# Patient Record
Sex: Female | Born: 1950 | Race: White | Hispanic: No | Marital: Married | Smoking: Former smoker
Health system: Southern US, Community
[De-identification: ages and names within clinical notes are randomized; demographics above are authoritative.]

## PROBLEM LIST (undated history)

## (undated) DIAGNOSIS — F32A Depression, unspecified: Secondary | ICD-10-CM

## (undated) DIAGNOSIS — F329 Major depressive disorder, single episode, unspecified: Secondary | ICD-10-CM

## (undated) DIAGNOSIS — I10 Essential (primary) hypertension: Secondary | ICD-10-CM

## (undated) DIAGNOSIS — K5792 Diverticulitis of intestine, part unspecified, without perforation or abscess without bleeding: Secondary | ICD-10-CM

## (undated) DIAGNOSIS — E785 Hyperlipidemia, unspecified: Secondary | ICD-10-CM

## (undated) DIAGNOSIS — E119 Type 2 diabetes mellitus without complications: Secondary | ICD-10-CM

## (undated) HISTORY — PX: TONSILLECTOMY: SUR1361

## (undated) HISTORY — DX: Essential (primary) hypertension: I10

## (undated) HISTORY — PX: APPENDECTOMY: SHX54

## (undated) HISTORY — DX: Major depressive disorder, single episode, unspecified: F32.9

## (undated) HISTORY — DX: Type 2 diabetes mellitus without complications: E11.9

## (undated) HISTORY — DX: Diverticulitis of intestine, part unspecified, without perforation or abscess without bleeding: K57.92

## (undated) HISTORY — DX: Depression, unspecified: F32.A

## (undated) HISTORY — DX: Hyperlipidemia, unspecified: E78.5

---

## 2013-06-19 ENCOUNTER — Ambulatory Visit (INDEPENDENT_AMBULATORY_CARE_PROVIDER_SITE_OTHER): Payer: Self-pay | Admitting: Family Medicine

## 2013-06-19 ENCOUNTER — Encounter: Payer: Self-pay | Admitting: Family Medicine

## 2013-06-19 VITALS — BP 102/72 | Temp 98.8°F | Ht 59.0 in | Wt 200.0 lb

## 2013-06-19 DIAGNOSIS — L989 Disorder of the skin and subcutaneous tissue, unspecified: Secondary | ICD-10-CM

## 2013-06-19 DIAGNOSIS — I10 Essential (primary) hypertension: Secondary | ICD-10-CM

## 2013-06-19 DIAGNOSIS — Z7689 Persons encountering health services in other specified circumstances: Secondary | ICD-10-CM

## 2013-06-19 DIAGNOSIS — E785 Hyperlipidemia, unspecified: Secondary | ICD-10-CM

## 2013-06-19 DIAGNOSIS — Z7189 Other specified counseling: Secondary | ICD-10-CM

## 2013-06-19 DIAGNOSIS — E119 Type 2 diabetes mellitus without complications: Secondary | ICD-10-CM

## 2013-06-19 LAB — BASIC METABOLIC PANEL
BUN: 23 mg/dL (ref 6–23)
CHLORIDE: 107 meq/L (ref 96–112)
CO2: 29 meq/L (ref 19–32)
CREATININE: 0.6 mg/dL (ref 0.4–1.2)
Calcium: 9.2 mg/dL (ref 8.4–10.5)
GFR: 106.68 mL/min (ref >60.00)
GLUCOSE: 104 mg/dL — AB (ref 70–99)
Potassium: 4.4 mEq/L (ref 3.5–5.1)
Sodium: 142 mEq/L (ref 135–145)

## 2013-06-19 LAB — LIPID PANEL
CHOLESTEROL: 201 mg/dL — AB (ref 0–200)
HDL: 64.7 mg/dL (ref >39.00)
Total CHOL/HDL Ratio: 3
Triglycerides: 86 mg/dL (ref 0.0–149.0)
VLDL: 17.2 mg/dL (ref 0.0–40.0)

## 2013-06-19 LAB — HEMOGLOBIN A1C: Hgb A1c MFr Bld: 6 % (ref 4.6–6.5)

## 2013-06-19 LAB — MICROALBUMIN / CREATININE URINE RATIO
CREATININE, U: 132.7 mg/dL
Microalb Creat Ratio: 0.3 mg/g (ref 0.0–30.0)
Microalb, Ur: 0.4 mg/dL (ref 0.0–1.9)

## 2013-06-19 LAB — LDL CHOLESTEROL, DIRECT: LDL DIRECT: 113.3 mg/dL

## 2013-06-19 NOTE — Patient Instructions (Signed)
-  We have ordered labs or studies at this visit. It can take up to 1-2 weeks for results and processing. We will contact you with instructions IF your results are abnormal. Normal results will be released to your [facility]. If you have not heard from [place] or can not find your results in [facility] in 2 weeks please contact our office.  -PLEASE SIGN UP FOR MYCHART TODAY   We recommend the following healthy lifestyle measures: - eat a healthy diet consisting of lots of vegetables, fruits, beans, nuts, seeds, healthy meats such as white chicken and fish and whole grains.  - avoid fried foods, fast food, processed foods, sodas, red meet and other fattening foods.  - get a least 150 minutes of aerobic exercise per week.   -schedule follow up with dermatologist if lesion on nose persist  Follow up in: 2 months

## 2013-06-19 NOTE — Progress Notes (Signed)
Pre visit review using our clinic review tool, if applicable. No additional management support is needed unless otherwise documented below in the visit note. 

## 2013-06-19 NOTE — Progress Notes (Signed)
Chief Complaint  Patient presents with  . Establish Care    HPI:  Amanda Molina is here to establish care. Recently moved to [place].  Last PCP and physical: had routine care 6 months ago  Depression: -chronic, on and off -on SSRI in the past -separated from husband in the past -no hospitalization in the past -gets moody a times during holidays, no thoughts of self harm -she does not want to do counseling now  Diabetes Mellitus: -ran out of metformin - 2 months ago -BS at home has been 110 -does not know what hgba1c was -has not been watching diet or exercising  HLD/HTN: -ran out of medications recently  Has the following chronic problems and concerns today:  There are no active problems to display for this patient.   Health Maintenance:  ROS: See pertinent positives and negatives per HPI.  Past Medical History  Diagnosis Date  . Diabetes   . Hypertension   . Depression   . Diverticulitis   . Hyperlipidemia     Family History  Problem Relation Age of Onset  . COPD Mother   . Heart disease Mother     History   Social History  . Marital Status: Married    Spouse Name: N/A    Number of Children: N/A  . Years of Education: N/A   Social History Main Topics  . Smoking status: Former Research scientist (life sciences)  . Smokeless tobacco: None  . Alcohol Use: Yes     Comment: rare- usually not when on medicaiton   . Drug Use: None  . Sexual Activity: None   Other Topics Concern  . 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      Spiritual Beliefs: Christian      Lifestyle: not good now             Current outpatient prescriptions:albuterol (PROVENTIL HFA;VENTOLIN HFA) 108 (90 BASE) MCG/ACT inhaler, Inhale into the lungs every 6 (six) hours as needed for wheezing or shortness of breath., Disp: , Rfl: ;  citalopram (CELEXA) 10 MG tablet, Take 10 mg by mouth daily., Disp: , Rfl: ;   clonazePAM (KLONOPIN) 0.5 MG tablet, Take 0.5 mg by mouth daily., Disp: , Rfl:  fluticasone (FLOVENT HFA) 110 MCG/ACT inhaler, Inhale into the lungs 2 (two) times daily., Disp: , Rfl: ;  lisinopril-hydrochlorothiazide (PRINZIDE,ZESTORETIC) 10-12.5 MG per tablet, Take 1 tablet by mouth daily., Disp: , Rfl: ;  METFORMIN HCL PO, Take 50 mg by mouth daily., Disp: , Rfl: ;  simvastatin (ZOCOR) 20 MG tablet, Take 20 mg by mouth daily., Disp: , Rfl:   EXAM:  Filed Vitals:   06/19/13 0913  BP: 102/72  Temp: 98.8 F (37.1 C)    Body mass index is 40.37 kg/(m^2).  GENERAL: vitals reviewed and listed above, alert, oriented, appears well hydrated and in no acute distress  HEENT: atraumatic, conjunttiva clear, no obvious abnormalities on inspection of external nose and ears  NECK: no obvious masses on inspection  LUNGS: clear to auscultation bilaterally, no wheezes, rales or rhonchi, good air movement  CV: HRRR, no peripheral edema  MS: moves all extremities without noticeable abnormality  PSYCH: pleasant and cooperative, no obvious depression or anxiety  ASSESSMENT AND PLAN:  Discussed the following assessment and plan:  Encounter to establish care  Diabetes mellitus - Plan: Hemoglobin A1c, Microalbumin/Creatinine Ratio, Urine  Other and unspecified hyperlipidemia - Plan: Lipid  Panel  Essential hypertension, benign - Plan: Basic metabolic panel  Skin lesion  -We reviewed the PMH, PSH, FH, SH, Meds and Allergies. -We provided refills for any medications we will prescribe as needed. -We addressed current concerns per orders and patient instructions. -We have asked for records for pertinent exams, studies, vaccines and notes from previous providers. -We have advised patient to follow up per instructions below. -advised CPE -FASTING LABS -noted what appears to be AK on nose versus other and discussed potential for skin cancer and advised she see dermatology - she does not have  insurance currently and prefer cryotherapy today and follow up with derm in 2 months when has insurance - risks of tx and waiting discussed and cryotherapy performed -offered flu and pneumo vaccines -discussed restarting medications after labs- she want to discontinue as many medications as possible   -Patient advised to return or notify a doctor immediately if symptoms worsen or persist or new concerns arise.  Patient Instructions  -We have ordered labs or studies at this visit. It can take up to 1-2 weeks for results and processing. We will contact you with instructions IF your results are abnormal. Normal results will be released to your [facility]. If you have not heard from [place] or can not find your results in [facility] in 2 weeks please contact our office.  -PLEASE SIGN UP FOR MYCHART TODAY   We recommend the following healthy lifestyle measures: - eat a healthy diet consisting of lots of vegetables, fruits, beans, nuts, seeds, healthy meats such as white chicken and fish and whole grains.  - avoid fried foods, fast food, processed foods, sodas, red meet and other fattening foods.  - get a least 150 minutes of aerobic exercise per week.   -schedule follow up with dermatologist if lesion on nose persist  Follow up in: 2 months      Stepfanie Yott R.

## 2013-09-17 ENCOUNTER — Encounter: Payer: Self-pay | Admitting: Family Medicine

## 2013-09-17 ENCOUNTER — Ambulatory Visit (INDEPENDENT_AMBULATORY_CARE_PROVIDER_SITE_OTHER): Payer: Medicare Other | Admitting: Family Medicine

## 2013-09-17 VITALS — BP 150/78 | Temp 98.4°F | Wt 203.0 lb

## 2013-09-17 DIAGNOSIS — E119 Type 2 diabetes mellitus without complications: Secondary | ICD-10-CM | POA: Diagnosis not present

## 2013-09-17 DIAGNOSIS — F3289 Other specified depressive episodes: Secondary | ICD-10-CM | POA: Diagnosis not present

## 2013-09-17 DIAGNOSIS — I10 Essential (primary) hypertension: Secondary | ICD-10-CM

## 2013-09-17 DIAGNOSIS — E785 Hyperlipidemia, unspecified: Secondary | ICD-10-CM | POA: Diagnosis not present

## 2013-09-17 DIAGNOSIS — F32A Depression, unspecified: Secondary | ICD-10-CM

## 2013-09-17 DIAGNOSIS — F329 Major depressive disorder, single episode, unspecified: Secondary | ICD-10-CM | POA: Diagnosis not present

## 2013-09-17 DIAGNOSIS — L989 Disorder of the skin and subcutaneous tissue, unspecified: Secondary | ICD-10-CM

## 2013-09-17 MED ORDER — LISINOPRIL-HYDROCHLOROTHIAZIDE 10-12.5 MG PO TABS: 1.0000 | ORAL_TABLET | Freq: Every day | ORAL | Status: DC

## 2013-09-17 MED ORDER — CITALOPRAM HYDROBROMIDE 10 MG PO TABS: 10.0000 mg | ORAL_TABLET | Freq: Every day | ORAL | Status: DC

## 2013-09-17 NOTE — Progress Notes (Signed)
Pre visit review using our clinic review tool, if applicable. No additional management support is needed unless otherwise documented below in the visit note. 

## 2013-09-17 NOTE — Progress Notes (Signed)
Chief Complaint  Patient presents with  . 3 month follow up    HPI:  Follow up:  AK nose: -tx with cryotherapy last visit -improved bu not gone  Diabetes: Lab Results  Component Value Date   HGBA1C 6.0 06/19/2013  -not on metformin as was doing great off of metformin  -home blood sugars in low 100s  HLD: Lab Results  Component Value Date   CHOL 201* 06/19/2013   HDL 64.70 06/19/2013   LDLDIRECT 113.3 06/19/2013   TRIG 86.0 06/19/2013   CHOLHDL 3 06/19/2013  -on simvastatin  HTN: -not taking the lisinopril-hctz    Depression/Anxiety: -celexa and klonopin - does not take these any longer -she doesn't wanted to take medications but feels like needs to be back on celexa -depressed and stressed about relationship with husband -she wants to started counseling -denies suicidal thoughts or thoughts of self harm  Asthma?; -flovent and alb on med list  Last physicaL?  ROS: See pertinent positives and negatives per HPI.  Past Medical History  Diagnosis Date  . Diabetes   . Hypertension   . Depression   . Diverticulitis   . Hyperlipidemia     Past Surgical History  Procedure Laterality Date  . Appendectomy      teenager  . Tonsillectomy      Family History  Problem Relation Age of Onset  . COPD Mother   . Heart disease Mother     History   Social History  . Marital Status: Married    Spouse Name: N/A    Number of Children: N/A  . Years of Education: N/A   Social History Main Topics  . Smoking status: Former Research scientist (life sciences)  . Smokeless tobacco: None  . Alcohol Use: Yes     Comment: rare- usually not when on medicaiton   . Drug Use: None  . Sexual Activity: None   Other Topics Concern  . 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  GENERAL: vitals reviewed and listed above, alert, oriented, appears well hydrated and in no acute distress  HEENT: atraumatic, conjunttiva clear, no obvious abnormalities on inspection of external nose and ears  NECK: no obvious masses on inspection  LUNGS: clear to auscultation bilaterally, no wheezes, rales or rhonchi, good air movement  CV: HRRR, no peripheral edema  MS: moves all extremities without noticeable abnormality  PSYCH: pleasant and cooperative, no obvious depression or anxiety  ASSESSMENT AND PLAN:  Discussed the following assessment and plan:  Depression - Plan: citalopram (CELEXA) 10 MG tablet  Diabetes mellitus  Hyperlipemia  Essential hypertension, benign - Plan: lisinopril-hydrochlorothiazide (PRINZIDE,ZESTORETIC) 10-12.5 MG per tablet  Skin lesion of face - Plan: Ambulatory referral to Dermatology  -counseled at length -opted to restart  celexa after discussion risks/benefits -counseling info provided -will have office manager check on helping her with medicare enrollement help -restart BP medication -follow up 1 month and fasting labs then per her request -Patient advised to return or notify a doctor immediately if symptoms worsen or persist or new concerns arise.  Patient Instructions  We recommend the following healthy lifestyle measures: - eat a healthy diet consisting of lots of vegetables, fruits, beans, nuts, seeds, healthy meats such as white chicken and fish and whole grains.  - avoid fried foods, fast food, processed  foods, sodas, red meet and other fattening foods.  - get a least 150 minutes of aerobic exercise per week.   START the celexa and the lisinopril-hctz  Call for counseling  See a dermatologist for the lesion on your nose  Follow up in: 1 month      Yoceline Bazar R.

## 2013-09-17 NOTE — Patient Instructions (Signed)
We recommend the following healthy lifestyle measures: - eat a healthy diet consisting of lots of vegetables, fruits, beans, nuts, seeds, healthy meats such as white chicken and fish and whole grains.  - avoid fried foods, fast food, processed foods, sodas, red meet and other fattening foods.  - get a least 150 minutes of aerobic exercise per week.   START the celexa and the lisinopril-hctz  Call for counseling  See a dermatologist for the lesion on your nose  Follow up in: 1 month

## 2013-09-21 ENCOUNTER — Telehealth: Payer: Self-pay | Admitting: Family Medicine

## 2013-09-21 NOTE — Telephone Encounter (Signed)
Relevant patient education mailed to patient.  

## 2013-09-28 DIAGNOSIS — D485 Neoplasm of uncertain behavior of skin: Secondary | ICD-10-CM | POA: Diagnosis not present

## 2013-09-28 DIAGNOSIS — L821 Other seborrheic keratosis: Secondary | ICD-10-CM | POA: Diagnosis not present

## 2013-09-28 DIAGNOSIS — Z9189 Other specified personal risk factors, not elsewhere classified: Secondary | ICD-10-CM | POA: Diagnosis not present

## 2013-09-28 DIAGNOSIS — L570 Actinic keratosis: Secondary | ICD-10-CM | POA: Diagnosis not present

## 2013-10-09 ENCOUNTER — Telehealth: Payer: Self-pay

## 2013-10-09 NOTE — Telephone Encounter (Signed)
Relevant patient education mailed to patient.  

## 2013-10-22 ENCOUNTER — Ambulatory Visit (INDEPENDENT_AMBULATORY_CARE_PROVIDER_SITE_OTHER): Payer: Medicare Other | Admitting: Family Medicine

## 2013-10-22 ENCOUNTER — Encounter: Payer: Self-pay | Admitting: Family Medicine

## 2013-10-22 VITALS — BP 120/66 | HR 80 | Temp 98.4°F | Ht 59.0 in | Wt 205.0 lb

## 2013-10-22 DIAGNOSIS — I10 Essential (primary) hypertension: Secondary | ICD-10-CM

## 2013-10-22 DIAGNOSIS — F3289 Other specified depressive episodes: Secondary | ICD-10-CM

## 2013-10-22 DIAGNOSIS — E119 Type 2 diabetes mellitus without complications: Secondary | ICD-10-CM

## 2013-10-22 DIAGNOSIS — F329 Major depressive disorder, single episode, unspecified: Secondary | ICD-10-CM

## 2013-10-22 DIAGNOSIS — E785 Hyperlipidemia, unspecified: Secondary | ICD-10-CM | POA: Diagnosis not present

## 2013-10-22 DIAGNOSIS — F32A Depression, unspecified: Secondary | ICD-10-CM

## 2013-10-22 LAB — BASIC METABOLIC PANEL
BUN: 19 mg/dL (ref 6–23)
CHLORIDE: 101 meq/L (ref 96–112)
CO2: 31 mEq/L (ref 19–32)
Calcium: 9.4 mg/dL (ref 8.4–10.5)
Creatinine, Ser: 0.9 mg/dL (ref 0.4–1.2)
GFR: 70.34 mL/min (ref >60.00)
Glucose, Bld: 99 mg/dL (ref 70–99)
Potassium: 4.1 mEq/L (ref 3.5–5.1)
SODIUM: 140 meq/L (ref 135–145)

## 2013-10-22 LAB — LIPID PANEL
Cholesterol: 207 mg/dL — ABNORMAL HIGH (ref 0–200)
HDL: 65.7 mg/dL (ref >39.00)
LDL Cholesterol: 118 mg/dL — ABNORMAL HIGH (ref 0–99)
Total CHOL/HDL Ratio: 3
Triglycerides: 117 mg/dL (ref 0.0–149.0)
VLDL: 23.4 mg/dL (ref 0.0–40.0)

## 2013-10-22 LAB — MICROALBUMIN / CREATININE URINE RATIO
CREATININE, U: 116 mg/dL
MICROALB/CREAT RATIO: 0.3 mg/g (ref 0.0–30.0)
Microalb, Ur: 0.4 mg/dL (ref 0.0–1.9)

## 2013-10-22 LAB — HEMOGLOBIN A1C: HEMOGLOBIN A1C: 6.4 % (ref 4.6–6.5)

## 2013-10-22 MED ORDER — CITALOPRAM HYDROBROMIDE 20 MG PO TABS: 10.0000 mg | ORAL_TABLET | Freq: Every day | ORAL | Status: DC

## 2013-10-22 NOTE — Progress Notes (Signed)
No chief complaint on file.   HPI:  Depression: -restarted celexa last visit -reports is doing great - relationships have improved -she is doing so much better but still a bit of irritability in the afternoons per family members  -not doing counseling yet  HTN: -restarted lis-hctz last visit -reports feels great -denies: CP, SOB, palpitations, swelling  DM/HLD: -labs today  ROS: See pertinent positives and negatives per HPI.  Past Medical History  Diagnosis Date  . Diabetes   . Hypertension   . Depression   . Diverticulitis   . Hyperlipidemia     Past Surgical History  Procedure Laterality Date  . Appendectomy      teenager  . Tonsillectomy      Family History  Problem Relation Age of Onset  . COPD Mother   . Heart disease Mother     History   Social History  . Marital Status: Married    Spouse Name: N/A    Number of Children: N/A  . Years of Education: N/A   Social History Main Topics  . Smoking status: Former Research scientist (life sciences)  . Smokeless tobacco: None  . Alcohol Use: Yes     Comment: rare- usually not when on medicaiton   . Drug Use: None  . Sexual Activity: None   Other Topics Concern  . 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  GENERAL: vitals reviewed and listed above, alert, oriented, appears well hydrated and in no acute distress  HEENT: atraumatic, conjunttiva clear, no obvious  abnormalities on inspection of external nose and ears  NECK: no obvious masses on inspection  LUNGS: clear to auscultation bilaterally, no wheezes, rales or rhonchi, good air movement  CV: HRRR, no peripheral edema  MS: moves all extremities without noticeable abnormality  PSYCH: pleasant and cooperative, no obvious depression or anxiety  ASSESSMENT AND PLAN:  Discussed the following assessment and plan:  Depression - Plan: citalopram (CELEXA) 20 MG tablet  Hypertension  Diabetes - Plan: Hemoglobin I6E, Basic metabolic panel, Microalbumin/Creatinine Ratio, Urine  Hyperlipemia - Plan: Lipid Panel  -increasing celexa to 20mg  daily -advised counseling -FASTING labs today -continue to monitor blood pressure -Patient advised to return or notify a doctor immediately if symptoms worsen or persist or new concerns arise.  Patient Instructions  FOR YOUR ANXIETY, STRESS or DEPRESSION:  []  Increase the celexa to 20mg  daily  []  Seek counseling - this is as effective as medications and will help to get at the root of the imbalance. Please use the number provided to set up an appointment.  []  Ensure AT LEAST 150 minutes of cardiovascular exercise per week - 30 minutes of sweaty exercise daily is best.  []  Set a schedule that includes adequate time for sleep, fun activities and exercise.  []  Medications are best used short term while finding a healthier  more balanced life that promotes good emotional and mental health. I do not prescribe sleep medications such as Ambien, etc. or controlled anxiety medications such as Xanax, Klonopin, etc. long term in adult patients and will have you see a psychiatrist if these types of medications are required on more then a temporary basis.  FOR YOUR DIABETES:  []  Eat a healthy low carb diet (avoid sweets, sweet drinks, breads, potatoes, rice, etc.) and ensure 3 small meals daily.  []  Get AT LEAST 150 minutes of cardiovascular exercise per week - 30  minutes per day is best of sustained sweaty exercise.  []  See an eye doctor every year and fax your diabetic eye exam to our office.  Fax: [phone]  []  Take good care of your feet and keep them soft and callus free. Check your feet daily and wear comfortable shoes. See your doctor immediately if you have any cuts, calluses or wounds on your feet.  -We have ordered labs or studies at this visit. It can take up to 1-2 weeks for results and processing. We will contact you with instructions IF your results are abnormal. Normal results will be released to your [facility]. If you have not heard from [place] or can not find your results in [facility] in 2 weeks please contact our office.   Follow up in 3 months for [place] and follow up        Lucretia Kern

## 2013-10-22 NOTE — Progress Notes (Signed)
Pre visit review using our clinic review tool, if applicable. No additional management support is needed unless otherwise documented below in the visit note. 

## 2013-10-22 NOTE — Patient Instructions (Addendum)
FOR YOUR ANXIETY, STRESS or DEPRESSION:  []  Increase the celexa to 20mg  daily  []  Seek counseling - this is as effective as medications and will help to get at the root of the imbalance. Please use the number provided to set up an appointment.  []  Ensure AT LEAST 150 minutes of cardiovascular exercise per week - 30 minutes of sweaty exercise daily is best.  []  Set a schedule that includes adequate time for sleep, fun activities and exercise.  []  Medications are best used short term while finding a healthier more balanced life that promotes good emotional and mental health. I do not prescribe sleep medications such as Ambien, etc. or controlled anxiety medications such as Xanax, Klonopin, etc. long term in adult patients and will have you see a psychiatrist if these types of medications are required on more then a temporary basis.  FOR YOUR DIABETES:  []  Eat a healthy low carb diet (avoid sweets, sweet drinks, breads, potatoes, rice, etc.) and ensure 3 small meals daily.  []  Get AT LEAST 150 minutes of cardiovascular exercise per week - 30 minutes per day is best of sustained sweaty exercise.  []  See an eye doctor every year and fax your diabetic eye exam to our office.  Fax: [phone]  []  Take good care of your feet and keep them soft and callus free. Check your feet daily and wear comfortable shoes. See your doctor immediately if you have any cuts, calluses or wounds on your feet.  -We have ordered labs or studies at this visit. It can take up to 1-2 weeks for results and processing. We will contact you with instructions IF your results are abnormal. Normal results will be released to your [facility]. If you have not heard from [place] or can not find your results in [facility] in 2 weeks please contact our office.   Follow up in 3 months for [place] and follow up

## 2013-10-26 ENCOUNTER — Telehealth: Payer: Self-pay | Admitting: Family Medicine

## 2013-10-26 DIAGNOSIS — F32A Depression, unspecified: Secondary | ICD-10-CM

## 2013-10-26 DIAGNOSIS — F329 Major depressive disorder, single episode, unspecified: Secondary | ICD-10-CM

## 2013-10-26 MED ORDER — CITALOPRAM HYDROBROMIDE 20 MG PO TABS: 20.0000 mg | ORAL_TABLET | Freq: Every day | ORAL | Status: DC

## 2013-10-26 NOTE — Telephone Encounter (Signed)
It was sent as 20mg  but for some reason  0.5 tab per day copied to the instructions - please let her know I resent this and am sorry for the error. Thanks.

## 2013-10-26 NOTE — Telephone Encounter (Signed)
Patient informed. 

## 2013-10-26 NOTE — Telephone Encounter (Signed)
Is it OK to send in the Rx for Celexa 20mg ?  The office visit states you were increasing to 20mg  daily but the Rx that was sent in was for Celexa 1mg -take 0.5mg  daily.

## 2013-10-26 NOTE — Telephone Encounter (Signed)
Pt was to have celexa increased celexa to 20mg  daily, but new rx was for 20 mg, but instructions states .05   tab. Pt need new rx  for this  [facility] [address] pls call pt w/ new instructions.

## 2013-11-26 ENCOUNTER — Encounter: Payer: Self-pay | Admitting: Physician Assistant

## 2013-11-26 ENCOUNTER — Ambulatory Visit (INDEPENDENT_AMBULATORY_CARE_PROVIDER_SITE_OTHER): Payer: Medicare Other | Admitting: Physician Assistant

## 2013-11-26 VITALS — BP 114/70 | HR 84 | Temp 98.7°F | Resp 18

## 2013-11-26 DIAGNOSIS — M25579 Pain in unspecified ankle and joints of unspecified foot: Secondary | ICD-10-CM | POA: Diagnosis not present

## 2013-11-26 LAB — URIC ACID: Uric Acid, Serum: 5.3 mg/dL (ref 2.4–7.0)

## 2013-11-26 MED ORDER — HYDROCODONE-ACETAMINOPHEN 10-325 MG PO TABS: 1.0000 | ORAL_TABLET | Freq: Three times a day (TID) | ORAL | Status: DC | PRN

## 2013-11-26 MED ORDER — PREDNISONE 20 MG PO TABS: ORAL_TABLET | ORAL | Status: DC

## 2013-11-26 NOTE — Patient Instructions (Addendum)
Rest, Ice, Elevate the ankle to speed recovery.  Prednisone 2 pills daily for inflammation taken with food. Then taper as directed.  Vicodin for pain. Do not drive while taking this medication as it will inhibit your ability to operate a motor vehicle.  Follow up next week with your Primary Care Provider to reassess, or sooner for worsening symptoms despite treatment.    Gout Gout is when your joints become red, sore, and swell (inflammed). This is caused by the buildup of uric acid crystals in the joints. Uric acid is a chemical that is normally in the blood. If the level of uric acid gets too high in the blood, these crystals form in your joints and tissues. Over time, these crystals can form into masses near the joints and tissues. These masses can destroy bone and cause the bone to look misshapen (deformed). HOME CARE   Do not take aspirin for pain.  Only take medicine as told by your doctor.  Rest the joint as much as you can. When in bed, keep sheets and blankets off painful areas.  Keep the sore joints raised (elevated).  Put warm or cold packs on painful joints. Use of warm or cold packs depends on which works best for you.  Use crutches if the painful joint is in your leg.  Drink enough fluids to keep your pee (urine) clear or pale yellow. Limit alcohol, sugary drinks, and drinks with fructose in them.  Follow your diet instructions. Pay careful attention to how much protein you eat. Include fruits, vegetables, whole grains, and fat-free or low-fat milk products in your daily diet. Talk to your doctor or dietician about the use of coffee, vitamin C, and cherries. These may help lower uric acid levels.  Keep a healthy body weight. GET HELP RIGHT AWAY IF:   You have watery poop (diarrhea), throw up (vomit), or have any side effects from medicines.  You do not feel better in 24 hours, or you are getting worse.  Your joint becomes suddenly more tender, and you have chills or  a fever. MAKE SURE YOU:   Understand these instructions.  Will watch your condition.  Will get help right away if you are not doing well or get worse. Document Released: 03/13/2008 Document Revised: 09/29/2012 Document Reviewed: 09/12/2009 [facility] Patient Information 2014 [place].

## 2013-11-26 NOTE — Progress Notes (Signed)
Subjective:    Patient ID: Amanda Molina, female    DOB: 08-Oct-1950, 63 y.o.   MRN: [id number]  Ankle Pain  The incident occurred 3 to 5 days ago. The incident occurred at home. There was no injury mechanism (does not remember hurting ankle). Quality: "just hurts" The pain is at a severity of 10/10 (can't sleep). The pain is severe. The pain has been constant since onset. Associated symptoms include an inability to bear weight and a loss of motion. Pertinent negatives include no loss of sensation, muscle weakness, numbness or tingling. She reports no foreign bodies present. The symptoms are aggravated by movement, palpation and weight bearing. She has tried NSAIDs (percocet left over from old dental procedure.) for the symptoms. The treatment provided mild relief.      Review of Systems  Constitutional: Negative for fever and chills.  Respiratory: Negative for shortness of breath.   Gastrointestinal: Negative for nausea, vomiting and diarrhea.  Musculoskeletal: Positive for gait problem and joint swelling.  Neurological: Negative for tingling and numbness.  All other systems reviewed and are negative.    Past Medical History  Diagnosis Date  . Diabetes   . Hypertension   . Depression   . Diverticulitis   . Hyperlipidemia    Past Surgical History  Procedure Laterality Date  . Appendectomy      teenager  . Tonsillectomy      reports that she has quit smoking. She does not have any smokeless tobacco history on file. She reports that she drinks alcohol. Her drug history is not on file. family history includes COPD in her mother; Heart disease in her mother. Allergies  Allergen Reactions  . Sulfa Antibiotics        Objective:   Physical Exam  Nursing note and vitals reviewed. Constitutional: She is oriented to person, place, and time. She appears well-developed and well-nourished. No distress.  HENT:  Head: Normocephalic and atraumatic.  Eyes: Conjunctivae and EOM  are normal. Pupils are equal, round, and reactive to light.  Neck: Normal range of motion. Neck supple.  Cardiovascular: Normal rate, regular rhythm, normal heart sounds and intact distal pulses.  Exam reveals no gallop and no friction rub.   No murmur heard. Pulmonary/Chest: Effort normal and breath sounds normal. No respiratory distress. She has no wheezes. She has no rales. She exhibits no tenderness.  Musculoskeletal: She exhibits edema (limited to the left ankle, very mild) and tenderness.  Painful range of motion of the left ankle, especially with lateral movement and dorsiflexion.  Pain to palpation over the lateral and medial malleolus of the left ankle. No pain to palpation of the proximal fibular head, or the MTPs of the left foot. No pain to palpation of the arch of the left foot.   Neurological: She is alert and oriented to person, place, and time.  Skin: Skin is warm and dry. No rash noted. She is not diaphoretic. No erythema. No pallor.  There is no erythema to left ankle. Tenderness to palpation as mentioned above. No excessive warmth, no fluctuance. No visible foreign body or puncture.  Psychiatric: She has a normal mood and affect. Her behavior is normal. Judgment and thought content normal.     Filed Vitals:   11/26/13 1623  BP: 114/70  Pulse: 84  Temp: 98.7 F (37.1 C)  Resp: 18     Lab Results  Component Value Date   GLUCOSE 99 10/22/2013   CHOL 207* 10/22/2013   TRIG 117.0 10/22/2013  HDL 65.70 10/22/2013   LDLDIRECT 113.3 06/19/2013   LDLCALC 118* 10/22/2013   NA 140 10/22/2013   K 4.1 10/22/2013   CL 101 10/22/2013   CREATININE 0.9 10/22/2013   BUN 19 10/22/2013   CO2 31 10/22/2013   HGBA1C 6.4 10/22/2013   MICROALBUR 0.4 10/22/2013          Assessment & Plan:  Amanda Molina was seen today for ankle pain.  Diagnoses and associated orders for this visit:  Pain in joint, ankle and foot Comments: Sudden onset, no injury, possible hx of same that resolved without treatment.  ?gout. - Uric Acid - HYDROcodone-acetaminophen (NORCO) 10-325 MG per tablet; Take 1 tablet by mouth every 8 (eight) hours as needed (NO DRIVING WHILE TAKING THIS MEDICATION.). - predniSONE (DELTASONE) 20 MG tablet; 2 pills tonight, then 2 pills daily for 3 days, then 1 pill daily for 3 days, then half a pill every other day for 2 weeks. Take every dose with a meal.   No diagnosis of Gout, however history of similar ankle pain in the past that resolved spontaneously after a few days to a week. Dr. Sherren Mocha was consulted, and strongly felt that, with the history given, and lack of injury, that the pt was experiencing gout. Prednisone course with taper and Vicodin PRN due to severity of pain. Will have pt follow up with PCP early next week.   Plan is to follow up with PCP early next week, or sooner if symptoms worsen despite treatment.    Patient Instructions  Rest, Ice, Elevate the ankle to speed recovery.  Prednisone 2 pills daily for inflammation taken with food. Then taper as directed.  Vicodin for pain. Do not drive while taking this medication as it will inhibit your ability to operate a motor vehicle.  Follow up next week with your Primary Care Provider to reassess, or sooner for worsening symptoms despite treatment.

## 2013-12-01 ENCOUNTER — Encounter: Payer: Self-pay | Admitting: Family Medicine

## 2013-12-01 ENCOUNTER — Ambulatory Visit (INDEPENDENT_AMBULATORY_CARE_PROVIDER_SITE_OTHER): Payer: Medicare Other | Admitting: Family Medicine

## 2013-12-01 VITALS — BP 120/82 | HR 82 | Temp 98.0°F | Ht 59.0 in | Wt 209.5 lb

## 2013-12-01 DIAGNOSIS — I10 Essential (primary) hypertension: Secondary | ICD-10-CM

## 2013-12-01 DIAGNOSIS — F329 Major depressive disorder, single episode, unspecified: Secondary | ICD-10-CM

## 2013-12-01 DIAGNOSIS — E119 Type 2 diabetes mellitus without complications: Secondary | ICD-10-CM | POA: Diagnosis not present

## 2013-12-01 DIAGNOSIS — E785 Hyperlipidemia, unspecified: Secondary | ICD-10-CM

## 2013-12-01 DIAGNOSIS — M109 Gout, unspecified: Secondary | ICD-10-CM | POA: Diagnosis not present

## 2013-12-01 DIAGNOSIS — F3289 Other specified depressive episodes: Secondary | ICD-10-CM | POA: Diagnosis not present

## 2013-12-01 DIAGNOSIS — F32A Depression, unspecified: Secondary | ICD-10-CM

## 2013-12-01 NOTE — Progress Notes (Signed)
Pre visit review using our clinic review tool, if applicable. No additional management support is needed unless otherwise documented below in the visit note. 

## 2013-12-01 NOTE — Patient Instructions (Signed)

## 2013-12-01 NOTE — Progress Notes (Signed)
No chief complaint on file.   HPI:  Follow up Gout: -seen last week by Roslynn Amble for acute pain and swelling of L ankle with hx similar - treated with prednisone and norco per review of notes -reports: doing so much better, mild pain but swelling resolved and pain so much better - on taper of prednisone, not using pain medication -denies:fevers, trauma, malaise, redness, falls  Depression: -reports pharamcist told her to take 0.5 tablets antidepressant, but she is taking whole tablet like I told her and doing well  ROS: See pertinent positives and negatives per HPI.  Past Medical History  Diagnosis Date  . Diabetes   . Hypertension   . Depression   . Diverticulitis   . Hyperlipidemia     Past Surgical History  Procedure Laterality Date  . Appendectomy      teenager  . Tonsillectomy      Family History  Problem Relation Age of Onset  . COPD Mother   . Heart disease Mother     History   Social History  . Marital Status: Married    Spouse Name: N/A    Number of Children: N/A  . Years of Education: N/A   Social History Main Topics  . Smoking status: Former Research scientist (life sciences)  . Smokeless tobacco: None  . Alcohol Use: Yes     Comment: rare- usually not when on medicaiton   . Drug Use: None  . Sexual Activity: None   Other Topics Concern  . 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 Take  every dose with a meal., Disp: 15 tablet, Rfl: 0  EXAM:  Filed Vitals:   12/01/13 0913  BP: 120/82  Pulse: 82  Temp: 98 F (36.7 C)    Body mass index is 42.29 kg/(m^2).  GENERAL: vitals reviewed and listed above, alert, oriented, appears well hydrated and in no acute distress  HEENT: atraumatic, conjunttiva clear, no obvious abnormalities on inspection of external nose and ears  NECK: no obvious masses on inspection  LUNGS: clear to auscultation bilaterally, no wheezes, rales or rhonchi, good air movement  CV: HRRR, no peripheral edema  MS: moves all extremities without noticeable abnormality - no swelling or erythema of L ankle, normal  ROM,  NV intact distally, no TTP  PSYCH: pleasant and cooperative, no obvious depression or anxiety  ASSESSMENT AND PLAN:  Discussed the following assessment and plan:  Gout  Depression  Hypertension  Diabetes  Hyperlipemia  -doing well and recovering well - suspect this was gout and discussed other potential etiologies plan if recurs/etc -cleared up confusion on antidepressant and advised my assistant to contact pharmacy -follow up as needed and routinely -Patient advised to return or notify a doctor immediately if symptoms worsen or persist or new concerns arise.  Patient Instructions  Gout Gout is  an inflammatory arthritis caused by a buildup of uric acid crystals in the joints. Uric acid is a chemical that is normally present in the blood. When the level of uric acid in the blood is too high it can form crystals that deposit in your joints and tissues. This causes joint redness, soreness, and swelling (inflammation). Repeat attacks are common. Over time, uric acid crystals can form into masses (tophi) near a joint, destroying bone and causing disfigurement. Gout is treatable and often preventable. CAUSES  The disease begins with elevated levels of uric acid in the blood. Uric acid is produced by your body when it breaks down a  naturally found substance called purines. Certain foods you eat, such as meats and fish, contain high amounts of purines. Causes of an elevated uric acid level include:  Being passed down from parent to child (heredity).  Diseases that cause increased uric acid production (such as obesity, psoriasis, and certain cancers).  Excessive alcohol use.  Diet, especially diets rich in meat and seafood.  Medicines, including certain cancer-fighting medicines (chemotherapy), water pills (diuretics), and aspirin.  Chronic kidney disease. The kidneys are no longer able to remove uric acid well.  Problems with metabolism. Conditions strongly associated with gout include:  Obesity.  High blood pressure.  High cholesterol.  Diabetes. Not everyone with elevated uric acid levels gets gout. It is not understood why some people get gout and others do not. Surgery, joint injury, and eating too much of certain foods are some of the factors that can lead to gout attacks. SYMPTOMS   An attack of gout comes on quickly. It causes intense pain with redness, swelling, and warmth in a joint.  Fever can occur.  Often, only one joint is involved. Certain joints are more commonly involved:  Base of the big toe.  Knee.  Ankle.  Wrist.  Finger. Without treatment, an attack usually goes away in a few days to weeks. Between attacks, you usually will not have symptoms, which is different from many other forms of arthritis. DIAGNOSIS  Your caregiver will suspect gout based on your symptoms and exam. In some cases, tests may be recommended. The tests may include:  Blood tests.  Urine tests.  X-rays.  Joint fluid exam. This exam requires a needle to remove fluid from the joint (arthrocentesis). Using a microscope, gout is confirmed when uric acid crystals are seen in the joint fluid. TREATMENT  There are two phases to gout treatment: treating the sudden onset (acute) attack and preventing attacks  (prophylaxis).  Treatment of an Acute Attack.  Medicines are used. These include anti-inflammatory medicines or steroid medicines.  An injection of steroid medicine into the affected joint is sometimes necessary.  The painful joint is rested. Movement can worsen the arthritis.  You may use warm or cold treatments on painful joints, depending which works best for you.  Treatment to Prevent Attacks.  If you suffer from frequent gout attacks, your caregiver may advise preventive medicine. These medicines are started after the acute attack subsides. These medicines either help your kidneys eliminate uric acid from your body or decrease your uric acid production. You may need to stay on these medicines for a very long time.  The early phase of treatment with preventive medicine can be associated with an increase in acute gout attacks. For this reason, during the first few months of treatment, your caregiver may also advise you to take medicines usually used for acute gout treatment. Be sure you understand your  caregiver's directions. Your caregiver may make several adjustments to your medicine dose before these medicines are effective.  Discuss dietary treatment with your caregiver or dietitian. Alcohol and drinks high in sugar and fructose and foods such as meat, poultry, and seafood can increase uric acid levels. Your caregiver or dietician can advise you on drinks and foods that should be limited. HOME CARE INSTRUCTIONS   Do not take aspirin to relieve pain. This raises uric acid levels.  Only take over-the-counter or prescription medicines for pain, discomfort, or fever as directed by your caregiver.  Rest the joint as much as possible. When in bed, keep sheets and blankets off painful areas.  Keep the affected joint raised (elevated).  Apply warm or cold treatments to painful joints. Use of warm or cold treatments depends on which works best for you.  Use crutches if the painful joint  is in your leg.  Drink enough fluids to keep your urine clear or pale yellow. This helps your body get rid of uric acid. Limit alcohol, sugary drinks, and fructose drinks.  Follow your dietary instructions. Pay careful attention to the amount of protein you eat. Your daily diet should emphasize fruits, vegetables, whole grains, and fat-free or low-fat milk products. Discuss the use of coffee, vitamin C, and cherries with your caregiver or dietician. These may be helpful in lowering uric acid levels.  Maintain a healthy body weight. SEEK MEDICAL CARE IF:   You develop diarrhea, vomiting, or any side effects from medicines.  You do not feel better in 24 hours, or you are getting worse. SEEK IMMEDIATE MEDICAL CARE IF:   Your joint becomes suddenly more tender, and you have chills or a fever. MAKE SURE YOU:   Understand these instructions.  Will watch your condition.  Will get help right away if you are not doing well or get worse. Document Released: 06/01/2000 Document Revised: 09/29/2012 Document Reviewed: 01/16/2012 [facility] Patient Information 2014 [place], [place], [place]

## 2013-12-16 LAB — HM DIABETES EYE EXAM

## 2014-01-22 ENCOUNTER — Encounter: Payer: Self-pay | Admitting: Family Medicine

## 2014-01-22 ENCOUNTER — Ambulatory Visit (INDEPENDENT_AMBULATORY_CARE_PROVIDER_SITE_OTHER): Payer: Medicare Other | Admitting: Family Medicine

## 2014-01-22 VITALS — BP 124/82 | HR 76 | Temp 98.0°F | Ht 59.0 in | Wt 205.0 lb

## 2014-01-22 DIAGNOSIS — F329 Major depressive disorder, single episode, unspecified: Secondary | ICD-10-CM

## 2014-01-22 DIAGNOSIS — E119 Type 2 diabetes mellitus without complications: Secondary | ICD-10-CM | POA: Diagnosis not present

## 2014-01-22 DIAGNOSIS — F3289 Other specified depressive episodes: Secondary | ICD-10-CM

## 2014-01-22 DIAGNOSIS — I10 Essential (primary) hypertension: Secondary | ICD-10-CM

## 2014-01-22 DIAGNOSIS — F32A Depression, unspecified: Secondary | ICD-10-CM

## 2014-01-22 NOTE — Progress Notes (Signed)
Pre visit review using our clinic review tool, if applicable. No additional management support is needed unless otherwise documented below in the visit note. 

## 2014-01-22 NOTE — Patient Instructions (Addendum)
FOR YOUR DIABETES:  []  Eat a healthy low carb diet (avoid sweets, sweet drinks, breads, potatoes, rice, etc.) and ensure 3 small meals daily.  []  Get AT LEAST 150 minutes of cardiovascular exercise per week - 30 minutes per day is best of sustained sweaty exercise.  []  Take all of your medications every day as directed by your doctor. Call your doctor immediately if you have any questions about your medications or are running low.  []  Check your blood sugar often and when you feel unwell and keep a log to bring to all health appointments. FASTING: before you eat anything in the morning POSTPRANDIAL: 1-2 hours after a meal  []  If any low blood sugars < 70, eat a snack and call your doctor immediately.  []  See an eye doctor every year and fax your diabetic eye exam to our office.  Fax: [phone]  []  Take good care of your feet and keep them soft and callus free. Check your feet daily and wear comfortable shoes. See your doctor immediately if you have any cuts, calluses or wounds on your feet.  []  amlactin or cerave cream on feet at night with light pair of socks  Schedule mammogram

## 2014-01-22 NOTE — Progress Notes (Signed)
No chief complaint on file.   HPI:  Depression:  -meds: celexa -reports is doing great - relationships have improved  -she is doing so much better  -not doing counseling yet  -denies: SI, thoughts of self harm  HTN:  -meds: lisinopril-hctz -reports feels great  -denies: CP, SOB, palpitations, swelling   DM/HLD:  -diet controlled - she is seeing nutritionist with husband, no regular exercise, she is working on her diet -cholesterol very mildly above goal -denies: polyuria, polydipsia,  -pneumovax: declined -foot exam:today -eye exam: 12/16/13  ROS: See pertinent positives and negatives per HPI.  Past Medical History  Diagnosis Date  . Diabetes   . Hypertension   . Depression   . Diverticulitis   . Hyperlipidemia     Past Surgical History  Procedure Laterality Date  . Appendectomy      teenager  . Tonsillectomy      Family History  Problem Relation Age of Onset  . COPD Mother   . Heart disease Mother     History   Social History  . Marital Status: Married    Spouse Name: N/A    Number of Children: N/A  . Years of Education: N/A   Social History Main Topics  . Smoking status: Former Research scientist (life sciences)  . Smokeless tobacco: None  . Alcohol Use: Yes     Comment: rare- usually not when on medicaiton   . Drug Use: None  . Sexual Activity: None   Other Topics Concern  . 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  GENERAL: vitals reviewed and listed above, alert, oriented, appears well hydrated and in no acute distress  HEENT: atraumatic, conjunttiva clear, no obvious abnormalities on inspection of external nose and ears  NECK: no obvious masses on inspection  LUNGS: clear to auscultation bilaterally, no wheezes, rales or rhonchi, good air movement  CV: HRRR, no peripheral edema  MS: moves all extremities without noticeable abnormality  PSYCH: pleasant and cooperative, no obvious depression or anxiety  ASSESSMENT AND PLAN:  Discussed the following assessment and plan:  No diagnosis found.  -advised to schedule wellness exam -Patient advised to return or notify a doctor immediately if symptoms worsen or persist or new concerns arise.  Patient Instructions  FOR YOUR DIABETES:  []  Eat a healthy low carb diet (avoid sweets, sweet drinks, breads, potatoes, rice, etc.) and ensure 3 small meals daily.  []  Get AT LEAST 150 minutes of cardiovascular exercise per week - 30 minutes per day is best of sustained sweaty exercise.  []  Take all of your medications every day as directed by your doctor. Call your doctor immediately if you have any questions about your medications or are running low.  []  Check your blood sugar often and when you feel unwell and keep a log to bring to all health appointments.  FASTING: before you eat anything in the morning POSTPRANDIAL: 1-2 hours after a meal  []  If any low blood sugars < 70, eat a snack and call your doctor immediately.  []  See an eye doctor every year and fax your diabetic eye exam to our office.  Fax: [phone]  []  Take good care of your feet and keep them soft and callus free. Check your feet daily and wear comfortable shoes. See your doctor immediately if you have any cuts, calluses or wounds on your feet.  []  amlactin or cerave cream on feet at night with light pair of socks  Schedule mammogram     Debora Stockdale, Jarrett Soho R.

## 2014-03-11 ENCOUNTER — Encounter: Payer: Self-pay | Admitting: Family Medicine

## 2014-03-25 ENCOUNTER — Ambulatory Visit (INDEPENDENT_AMBULATORY_CARE_PROVIDER_SITE_OTHER): Payer: Medicare Other | Admitting: Family Medicine

## 2014-03-25 ENCOUNTER — Encounter: Payer: Self-pay | Admitting: Family Medicine

## 2014-03-25 VITALS — BP 116/78 | HR 74 | Temp 97.6°F | Ht 58.25 in | Wt 205.1 lb

## 2014-03-25 DIAGNOSIS — F32A Depression, unspecified: Secondary | ICD-10-CM

## 2014-03-25 DIAGNOSIS — I10 Essential (primary) hypertension: Secondary | ICD-10-CM

## 2014-03-25 DIAGNOSIS — F329 Major depressive disorder, single episode, unspecified: Secondary | ICD-10-CM

## 2014-03-25 DIAGNOSIS — Z23 Encounter for immunization: Secondary | ICD-10-CM

## 2014-03-25 DIAGNOSIS — Z0000 Encounter for general adult medical examination without abnormal findings: Secondary | ICD-10-CM

## 2014-03-25 DIAGNOSIS — E785 Hyperlipidemia, unspecified: Secondary | ICD-10-CM

## 2014-03-25 DIAGNOSIS — E119 Type 2 diabetes mellitus without complications: Secondary | ICD-10-CM | POA: Diagnosis not present

## 2014-03-25 DIAGNOSIS — E669 Obesity, unspecified: Secondary | ICD-10-CM

## 2014-03-25 LAB — LIPID PANEL
Cholesterol: 224 mg/dL — ABNORMAL HIGH (ref 0–200)
HDL: 63.1 mg/dL (ref >39.00)
LDL Cholesterol: 139 mg/dL — ABNORMAL HIGH (ref 0–99)
NonHDL: 160.9
TRIGLYCERIDES: 110 mg/dL (ref 0.0–149.0)
Total CHOL/HDL Ratio: 4
VLDL: 22 mg/dL (ref 0.0–40.0)

## 2014-03-25 LAB — BASIC METABOLIC PANEL
BUN: 16 mg/dL (ref 6–23)
CHLORIDE: 103 meq/L (ref 96–112)
CO2: 30 mEq/L (ref 19–32)
Calcium: 9.7 mg/dL (ref 8.4–10.5)
Creatinine, Ser: 0.7 mg/dL (ref 0.4–1.2)
GFR: 84.87 mL/min (ref >60.00)
GLUCOSE: 96 mg/dL (ref 70–99)
POTASSIUM: 4.7 meq/L (ref 3.5–5.1)
Sodium: 143 mEq/L (ref 135–145)

## 2014-03-25 LAB — HEMOGLOBIN A1C: Hgb A1c MFr Bld: 6.1 % (ref 4.6–6.5)

## 2014-03-25 MED ORDER — CITALOPRAM HYDROBROMIDE 20 MG PO TABS: 30.0000 mg | ORAL_TABLET | Freq: Every day | ORAL | Status: DC

## 2014-03-25 NOTE — Patient Instructions (Addendum)
-  schedule mammogram  -increase the celexa to 30mg  daily and follow up in 1 month or sooner if needed  We recommend the following healthy lifestyle measures: - eat a healthy diet consisting of lots of vegetables, fruits, beans, nuts, seeds, healthy meats such as white chicken and fish and whole grains.  - avoid fried foods, fast food, processed foods, sodas, red meet and other fattening foods.  - get a least 150 minutes of aerobic exercise per week.   Please see a lawyer and/or go to this website to help you with advanced directives and designating a health care power of attorney so that your wishes will be followed should you become too ill to make your own medical decisions.  [email or web address]  Try yeast cream clotrimazole or Monistat on itchy rash twice dialy for 1 week - recheck at follow up in 1 month

## 2014-03-25 NOTE — Progress Notes (Signed)
Pre visit review using our clinic review tool, if applicable. No additional management support is needed unless otherwise documented below in the visit note. 

## 2014-03-25 NOTE — Addendum Note (Signed)
Addended by: Agnes Lawrence on: 03/25/2014 11:54 AM   Modules accepted: Orders

## 2014-03-25 NOTE — Progress Notes (Signed)
Medicare Annual Preventive Care Visit  (initial annual wellness or annual wellness exam)  Concerns and/or follow up today:  Depression:  -meds: celexa  -reports not as good the last month do to stress related to husband being in hospital for blood sugar and gastroparesis, his mood has been tough -not doing counseling but reports her cats really help her -denies: SI, thoughts of self harm   HTN:  -meds: lisinopril-hctz  -reports feels great  -denies: CP, SOB, palpitations, swelling   DM/HLD:  -diet controlled - she is seeing nutritionist with husband, no regular exercise, she is working on her diet  -cholesterol very mildly above goal  -denies: polyuria, polydipsia,  -pneumovax: declined  -foot exam:done last visit -eye exam: 12/16/13 Lab Results  Component Value Date   HGBA1C 6.4 10/22/2013   Lab Results  Component Value Date   CHOL 207* 10/22/2013   HDL 65.70 10/22/2013   LDLCALC 118* 10/22/2013   LDLDIRECT 113.3 06/19/2013   TRIG 117.0 10/22/2013   CHOLHDL 3 10/22/2013    ROS: negative for report of fevers, unintentional weight loss, vision changes, vision loss, hearing loss or change, chest pain, sob, hemoptysis, melena, hematochezia, hematuria, genital discharge or lesions, falls, bleeding or bruising, loc, thoughts of suicide or self harm, memory loss  1.) Patient-completed health risk assessment  - completed and reviewed, see scanned documentation  2.) Review of Medical History: -PMH, PSH, Family History and current specialty and care providers reviewed and updated and listed below  - see scanned in document in chart and below  Past Medical History  Diagnosis Date  . Diabetes   . Hypertension   . Depression   . Diverticulitis   . Hyperlipidemia     Past Surgical History  Procedure Laterality Date  . Appendectomy      teenager  . Tonsillectomy      History   Social History  . Marital Status: Married    Spouse Name: N/A    Number of Children: N/A  . Years of  Education: N/A   Occupational History  . Not on file.   Social History Main Topics  . Smoking status: Former Research scientist (life sciences)  . Smokeless tobacco: Not on file  . Alcohol Use: Yes     Comment: rare- usually not when on medicaiton   . Drug Use: Not on file  . Sexual Activity: Not on file   Other Topics Concern  . Not on file   Social History Narrative   Work or School: retired, Pharmacist, community Situation: lives with husband      Spiritual Beliefs: Christian      Lifestyle: not good now             The patient has a family history of  3.) Review of functional ability and level of safety:  Any difficulty hearing? NO  History of falling? NO  Any trouble with IADLs - using a phone, using transportation, grocery shopping, preparing meals, doing housework, doing laundry, taking medications and managing money? NO  Advance Directives? NO  See summary of recommendations in Patient Instructions below.  4.) Physical Exam Filed Vitals:   03/25/14 1121  BP: 116/78  Pulse: 74  Temp: 97.6 F (36.4 C)   Estimated body mass index is 42.47 kg/(m^2) as calculated from the following:   Height as of this encounter: 4' 10.25" (1.48 m).   Weight as of this encounter: 205 lb 1.6 oz (93.033 kg).  EKG (optional): deferred  General: alert, appear well hydrated and in no acute distress  HEENT: visual acuity grossly intact  CV: HRRR  Lungs: CTA bilaterally  Psych: pleasant and cooperative, no obvious depression or anxiety  Breast exam: declined  GU: declined  Psych: pleasant, cooperative, no obvious depression or anxiety today  Mini Cog: 1. Patient instructed to listen carefully and repeat the following: [place]  2. Clock drawing test was administered: NORMAL       3. Recall of three words 3/3  Scoring:  Patient Score: NEG   See patient instructions for recommendations.  Education and counseling regarding the above review of health  provided with a plan for the following: -see scanned patient completed form for further details -fall prevention strategies discussed  -healthy lifestyle discussed -importance and resources for completing advanced directives discussed -see patient instructions below for any other recommendations provided  4)The following written screening schedule of preventive measures were reviewed with assessment and plan made per below, orders and patient instructions:      AAA screening: n/a     Alcohol screening: no     Obesity Screening and counseling:done     STI screening: declined     Tobacco Screening: done       Pneumococcal (PPSV23 -one dose after 64, one before if risk factors), influenza yearly and hepatitis B vaccines (if high risk - end stage renal disease, IV drugs, homosexual men, live in home for mentally retarded, hemophilia receiving factors) ASSESSMENT/PLAN: she is getting prevnar 56      Screening mammograph (yearly if >40) ASSESSMENT/PLAN: she is going to schedule this      Screening Pap smear/pelvic exam (q2 years) ASSESSMENT/PLAN: she reports all were normal, was about 2 years ago - declined any further pap smears      Prostate cancer screening ASSESSMENT/PLAN: n/a      Colorectal cancer screening (FOBT yearly or flex sig q4y or colonoscopy q10y or barium enema q4y) ASSESSMENT/PLAN:  Reports done 3 years ago      Diabetes outpatient self-management training services ASSESSMENT/PLAN: done      Bone mass measurements(covered q2y if indicated - estrogen def, osteoporosis, hyperparathyroid, vertebral abnormalities, osteoporosis or steroids) ASSESSMENT/PLAN: declined screening      Screening for glaucoma(q1y if high risk - diabetes, FH, AA and > 50 or hispanic and > 65) ASSESSMENT/PLAN: sees her opthomologist in July      Medical nutritional therapy for individuals with diabetes or renal disease ASSESSMENT/PLAN: done      Cardiovascular screening blood tests (lipids  q5y) ASSESSMENT/PLAN: doing today      Diabetes screening tests ASSESSMENT/PLAN: doing today    7.) Summary: -risk factors and conditions per above assessment were discussed and treatment, recommendations and referrals were offered per documentation above and orders and patient instructions.  Medicare annual wellness visit, initial  Depression - Plan: citalopram (CELEXA) 20 MG tablet  Essential hypertension  Type 2 diabetes mellitus without complication - Plan: Hemoglobin C3J, Basic metabolic panel  Mild hyperlipidemia - Plan: Lipid Panel  Obesity  Recheck labs, advised statin if lipids not at goal Advised metformin if hemoglobin A1c > 7 Increase celexa per her request after risks/benefits discussed Advised counseling Prevnar 13 and flu vaccines today FASTING Labs Follow up 1 month Stressed importance of healthy diet and regular exercise   Patient Instructions  -schedule mammogram  -increase the celexa to 30mg  daily and follow up in 1 month or sooner if needed  We recommend the following healthy  lifestyle measures: - eat a healthy diet consisting of lots of vegetables, fruits, beans, nuts, seeds, healthy meats such as white chicken and fish and whole grains.  - avoid fried foods, fast food, processed foods, sodas, red meet and other fattening foods.  - get a least 150 minutes of aerobic exercise per week.   Please see a lawyer and/or go to this website to help you with advanced directives and designating a health care power of attorney so that your wishes will be followed should you become too ill to make your own medical decisions.  [email or web address]  Try yeast cream clotrimazole or Monistat on itchy rash twice dialy for 1 week - recheck at follow up in 1 month

## 2014-03-26 ENCOUNTER — Encounter: Payer: Self-pay | Admitting: *Deleted

## 2014-03-26 MED ORDER — PRAVASTATIN SODIUM 40 MG PO TABS: 40.0000 mg | ORAL_TABLET | Freq: Every day | ORAL | Status: DC

## 2014-03-26 NOTE — Addendum Note (Signed)
Addended by: Agnes Lawrence on: 03/26/2014 09:36 AM   Modules accepted: Orders

## 2014-04-09 ENCOUNTER — Other Ambulatory Visit: Payer: Self-pay

## 2014-04-09 DIAGNOSIS — Z1231 Encounter for screening mammogram for malignant neoplasm of breast: Secondary | ICD-10-CM

## 2014-04-12 ENCOUNTER — Observation Stay (HOSPITAL_COMMUNITY)
Admission: EM | Admit: 2014-04-12 | Discharge: 2014-04-13 | Disposition: A | Discharge status: Home / Self Care | Payer: Medicare Other | Attending: Internal Medicine | Admitting: Internal Medicine

## 2014-04-12 ENCOUNTER — Observation Stay (HOSPITAL_COMMUNITY): Payer: Medicare Other

## 2014-04-12 ENCOUNTER — Encounter (HOSPITAL_COMMUNITY): Payer: Self-pay | Admitting: Emergency Medicine

## 2014-04-12 ENCOUNTER — Emergency Department (HOSPITAL_COMMUNITY): Payer: Medicare Other

## 2014-04-12 ENCOUNTER — Ambulatory Visit (INDEPENDENT_AMBULATORY_CARE_PROVIDER_SITE_OTHER): Payer: Medicare Other

## 2014-04-12 ENCOUNTER — Other Ambulatory Visit: Payer: Self-pay

## 2014-04-12 DIAGNOSIS — I951 Orthostatic hypotension: Secondary | ICD-10-CM | POA: Diagnosis not present

## 2014-04-12 DIAGNOSIS — Z79899 Other long term (current) drug therapy: Secondary | ICD-10-CM | POA: Insufficient documentation

## 2014-04-12 DIAGNOSIS — R404 Transient alteration of awareness: Secondary | ICD-10-CM | POA: Diagnosis not present

## 2014-04-12 DIAGNOSIS — Z87891 Personal history of nicotine dependence: Secondary | ICD-10-CM | POA: Insufficient documentation

## 2014-04-12 DIAGNOSIS — R55 Syncope and collapse: Secondary | ICD-10-CM

## 2014-04-12 DIAGNOSIS — F329 Major depressive disorder, single episode, unspecified: Principal | ICD-10-CM | POA: Insufficient documentation

## 2014-04-12 DIAGNOSIS — R42 Dizziness and giddiness: Secondary | ICD-10-CM

## 2014-04-12 DIAGNOSIS — F32A Depression, unspecified: Secondary | ICD-10-CM | POA: Diagnosis present

## 2014-04-12 DIAGNOSIS — R51 Headache: Secondary | ICD-10-CM | POA: Diagnosis not present

## 2014-04-12 DIAGNOSIS — I10 Essential (primary) hypertension: Secondary | ICD-10-CM | POA: Diagnosis not present

## 2014-04-12 DIAGNOSIS — K5792 Diverticulitis of intestine, part unspecified, without perforation or abscess without bleeding: Secondary | ICD-10-CM | POA: Diagnosis not present

## 2014-04-12 DIAGNOSIS — E089 Diabetes mellitus due to underlying condition without complications: Secondary | ICD-10-CM

## 2014-04-12 DIAGNOSIS — R110 Nausea: Secondary | ICD-10-CM | POA: Diagnosis not present

## 2014-04-12 DIAGNOSIS — E119 Type 2 diabetes mellitus without complications: Secondary | ICD-10-CM | POA: Diagnosis not present

## 2014-04-12 DIAGNOSIS — E785 Hyperlipidemia, unspecified: Secondary | ICD-10-CM | POA: Insufficient documentation

## 2014-04-12 DIAGNOSIS — R519 Headache, unspecified: Secondary | ICD-10-CM

## 2014-04-12 LAB — COMPREHENSIVE METABOLIC PANEL
ALK PHOS: 58 U/L (ref 39–117)
ALT: 10 U/L (ref 0–35)
AST: 15 U/L (ref 0–37)
Albumin: 3.5 g/dL (ref 3.5–5.2)
Anion gap: 11 (ref 5–15)
BUN: 19 mg/dL (ref 6–23)
CALCIUM: 9 mg/dL (ref 8.4–10.5)
CO2: 27 meq/L (ref 19–32)
Chloride: 104 mEq/L (ref 96–112)
Creatinine, Ser: 0.73 mg/dL (ref 0.50–1.10)
GFR, EST NON AFRICAN AMERICAN: 88 mL/min — AB (ref >90)
Glucose, Bld: 96 mg/dL (ref 70–99)
Potassium: 4.1 mEq/L (ref 3.7–5.3)
SODIUM: 142 meq/L (ref 137–147)
Total Bilirubin: 0.3 mg/dL (ref 0.3–1.2)
Total Protein: 6.7 g/dL (ref 6.0–8.3)

## 2014-04-12 LAB — CBC WITH DIFFERENTIAL/PLATELET
Basophils Absolute: 0 10*3/uL (ref 0.0–0.1)
Basophils Relative: 0 % (ref 0–1)
EOS PCT: 1 % (ref 0–5)
Eosinophils Absolute: 0.1 10*3/uL (ref 0.0–0.7)
HCT: 35.2 % — ABNORMAL LOW (ref 36.0–46.0)
Hemoglobin: 11.8 g/dL — ABNORMAL LOW (ref 12.0–15.0)
LYMPHS ABS: 1.7 10*3/uL (ref 0.7–4.0)
LYMPHS PCT: 31 % (ref 12–46)
MCH: 29.1 pg (ref 26.0–34.0)
MCHC: 33.5 g/dL (ref 30.0–36.0)
MCV: 86.9 fL (ref 78.0–100.0)
Monocytes Absolute: 0.3 10*3/uL (ref 0.1–1.0)
Monocytes Relative: 6 % (ref 3–12)
NEUTROS ABS: 3.3 10*3/uL (ref 1.7–7.7)
Neutrophils Relative %: 62 % (ref 43–77)
PLATELETS: 166 10*3/uL (ref 150–400)
RBC: 4.05 MIL/uL (ref 3.87–5.11)
RDW: 12.6 % (ref 11.5–15.5)
WBC: 5.4 10*3/uL (ref 4.0–10.5)

## 2014-04-12 LAB — URINALYSIS, ROUTINE W REFLEX MICROSCOPIC
Bilirubin Urine: NEGATIVE
GLUCOSE, UA: NEGATIVE mg/dL
Hgb urine dipstick: NEGATIVE
KETONES UR: NEGATIVE mg/dL
LEUKOCYTES UA: NEGATIVE
Nitrite: NEGATIVE
PH: 6 (ref 5.0–8.0)
PROTEIN: NEGATIVE mg/dL
Specific Gravity, Urine: 1.009 (ref 1.005–1.030)
Urobilinogen, UA: 0.2 mg/dL (ref 0.0–1.0)

## 2014-04-12 LAB — CBC
HCT: 35.5 % — ABNORMAL LOW (ref 36.0–46.0)
HEMOGLOBIN: 11.7 g/dL — AB (ref 12.0–15.0)
MCH: 28.1 pg (ref 26.0–34.0)
MCHC: 33 g/dL (ref 30.0–36.0)
MCV: 85.1 fL (ref 78.0–100.0)
Platelets: 173 10*3/uL (ref 150–400)
RBC: 4.17 MIL/uL (ref 3.87–5.11)
RDW: 12.5 % (ref 11.5–15.5)
WBC: 5.7 10*3/uL (ref 4.0–10.5)

## 2014-04-12 LAB — CREATININE, SERUM
Creatinine, Ser: 0.74 mg/dL (ref 0.50–1.10)
GFR calc Af Amer: >90 mL/min (ref >90)
GFR calc non Af Amer: 87 mL/min — ABNORMAL LOW (ref >90)

## 2014-04-12 LAB — TROPONIN I
Troponin I: <0.3 ng/mL (ref <0.30)
Troponin I: <0.3 ng/mL (ref <0.30)

## 2014-04-12 LAB — CK: Total CK: 132 U/L (ref 7–177)

## 2014-04-12 MED ORDER — SODIUM CHLORIDE 0.9 % IV BOLUS (SEPSIS)
1000.0000 mL | Freq: Once | INTRAVENOUS | Status: AC
  Administered 2014-04-12: 1000 mL via INTRAVENOUS

## 2014-04-12 MED ORDER — GUAIFENESIN-DM 100-10 MG/5ML PO SYRP: 5.0000 mL | ORAL_SOLUTION | ORAL | Status: DC | PRN

## 2014-04-12 MED ORDER — FOLIC ACID 1 MG PO TABS
1.0000 mg | ORAL_TABLET | Freq: Every day | ORAL | Status: DC
  Administered 2014-04-13: 1 mg via ORAL
  Filled 2014-04-12: qty 1

## 2014-04-12 MED ORDER — CITALOPRAM HYDROBROMIDE 20 MG PO TABS
30.0000 mg | ORAL_TABLET | Freq: Every day | ORAL | Status: DC
  Administered 2014-04-13: 30 mg via ORAL
  Filled 2014-04-12 (×2): qty 1

## 2014-04-12 MED ORDER — HYDROCODONE-ACETAMINOPHEN 5-325 MG PO TABS
1.0000 | ORAL_TABLET | ORAL | Status: DC | PRN
  Administered 2014-04-12: 1 via ORAL
  Administered 2014-04-13: 2 via ORAL
  Filled 2014-04-12: qty 1
  Filled 2014-04-12: qty 2

## 2014-04-12 MED ORDER — ALUM & MAG HYDROXIDE-SIMETH 200-200-20 MG/5ML PO SUSP: 30.0000 mL | Freq: Four times a day (QID) | ORAL | Status: DC | PRN

## 2014-04-12 MED ORDER — SODIUM CHLORIDE 0.9 % IV SOLN
INTRAVENOUS | Status: DC
  Administered 2014-04-12: via INTRAVENOUS

## 2014-04-12 MED ORDER — ONDANSETRON HCL 4 MG PO TABS: 4.0000 mg | ORAL_TABLET | Freq: Four times a day (QID) | ORAL | Status: DC | PRN

## 2014-04-12 MED ORDER — ONDANSETRON HCL 4 MG/2ML IJ SOLN: 4.0000 mg | Freq: Four times a day (QID) | INTRAMUSCULAR | Status: DC | PRN

## 2014-04-12 MED ORDER — POLYETHYLENE GLYCOL 3350 17 G PO PACK: 17.0000 g | PACK | Freq: Every day | ORAL | Status: DC | PRN

## 2014-04-12 MED ORDER — SODIUM CHLORIDE 0.9 % IJ SOLN
3.0000 mL | Freq: Two times a day (BID) | INTRAMUSCULAR | Status: DC
  Administered 2014-04-12 – 2014-04-13 (×2): 3 mL via INTRAVENOUS

## 2014-04-12 MED ORDER — HEPARIN SODIUM (PORCINE) 5000 UNIT/ML IJ SOLN
5000.0000 [IU] | Freq: Three times a day (TID) | INTRAMUSCULAR | Status: DC
  Administered 2014-04-12 – 2014-04-13 (×2): 5000 [IU] via SUBCUTANEOUS
  Filled 2014-04-12 (×2): qty 1

## 2014-04-12 NOTE — Plan of Care (Signed)
CT head done in ER still pending, Patient to be sent to the floor once CT results back, had accepted the patient with this understanding earlier.

## 2014-04-12 NOTE — ED Notes (Signed)
Pt was at the MD office waiting on her husband to be seen when she had a syncopal episode , pt was orthostatic with EMS , S b/p was 70 when standing

## 2014-04-12 NOTE — Progress Notes (Signed)
Patient was seen emergently in our office today. Was here with her husband, who is a patient here. She apparently had a syncopal episode and was taken into an exam room by w/c. BP 152/88 p79 02  Sat 97. Dr. Meda Coffee in the room. Patient regained consciousness, unaware what happened to her. CBG 100mg . EKG done. When she tried to sit up straighter in the chair, she developed pre-syncope again, and her legs were elevated. Repeat BP 132/90, p 80 02 sat 98%. EMS was called. She was then transported to [facility] ED.

## 2014-04-12 NOTE — ED Provider Notes (Signed)
CSN: [id number]     Arrival date & time 04/12/14  1244 History   First MD Initiated Contact with Patient 04/12/14 1307     Chief Complaint  Patient presents with  . Near Syncope     (Consider location/radiation/quality/duration/timing/severity/associated sxs/prior Treatment) The history is provided by the patient. No language interpreter was used.  Amanda Molina is a 63 y/o F with PMHx of diabetes, hypertension, hyperlipidemia, depression, diverticulitis presenting to the ED, accompanied by husband, regarding 2 syncopal episodes that occurred this afternoon. Patient reported that she was taking her husband to a doctor's appointment when while walking out of the building she felt a sudden onset of dizziness and had a syncopal episode. Patient reported that she had sudden onset of dizziness and fainted. Reported that she was helped up into a room in the doctor's office with a contour down. Husband reported that while sitting in the wheelchair patient had another syncopal episode. Both patient and husband are unable to recall exactly how long the patient was out for. Reported that during this time patient felt nauseous-stated that the nausea has improved. Patient reported that over the past week and a half she's been having intermittent headaches described as a nagging discomfort localized the posterior aspect. stated taht she did start a new medication - Pravastatin - approximately 2 weeks ago. Denied chest pain, shortness of breath, difficulty breathing, blurred vision, sudden loss of vision, fever, chills, weakness, numbness, tingling, loss of sensation, vomiting, diarrhea, worse headache of life, thunderclap onset. PCP Dr. Maudie Mercury  Past Medical History  Diagnosis Date  . Diabetes   . Hypertension   . Depression   . Diverticulitis   . Hyperlipidemia    Past Surgical History  Procedure Laterality Date  . Appendectomy      teenager  . Tonsillectomy     Family History  Problem Relation  Age of Onset  . COPD Mother   . Heart disease Mother    History  Substance Use Topics  . Smoking status: Former Research scientist (life sciences)  . Smokeless tobacco: Not on file  . Alcohol Use: Yes     Comment: rare- usually not when on medicaiton    OB History   Grav Para Term Preterm Abortions TAB SAB Ect Mult Living                 Review of Systems  Constitutional: Negative for chills and fatigue.  Respiratory: Negative for chest tightness and shortness of breath.   Cardiovascular: Negative for chest pain.  Gastrointestinal: Positive for nausea. Negative for vomiting.  Neurological: Positive for syncope and headaches. Negative for dizziness and weakness.      Allergies  Sulfa antibiotics  Home Medications   Prior to Admission medications   Medication Sig Start Date End Date Taking? Authorizing Provider  citalopram (CELEXA) 20 MG tablet Take 1.5 tablets (30 mg total) by mouth daily. 03/25/14  Yes Lucretia Kern, DO  FOLIC ACID PO Take by mouth daily.   Yes Historical Provider, MD  ibuprofen (ADVIL,MOTRIN) 200 MG tablet Take 400 mg by mouth every 6 (six) hours as needed for headache or mild pain.   Yes Historical Provider, MD  lisinopril-hydrochlorothiazide (PRINZIDE,ZESTORETIC) 10-12.5 MG per tablet Take 1 tablet by mouth daily. 09/17/13  Yes Lucretia Kern, DO  pravastatin (PRAVACHOL) 40 MG tablet Take 40 mg by mouth daily after lunch.   Yes Historical Provider, MD   BP 127/62  Pulse 69  Temp(Src) 98.7 F (37.1 C) (Oral)  Resp 17  SpO2 96% Physical Exam  Nursing note and vitals reviewed. Constitutional: She is oriented to person, place, and time. She appears well-developed and well-nourished. No distress.  HENT:  Head: Normocephalic and atraumatic.  Mouth/Throat: Oropharynx is clear and moist. No oropharyngeal exudate.  Eyes: Conjunctivae and EOM are normal. Pupils are equal, round, and reactive to light. Right eye exhibits no discharge. Left eye exhibits no discharge.   Photosensitivity Negative nystagmus PERRLA intact EOMs intact  Neck: Normal range of motion. Neck supple. No tracheal deviation present.  Negative neck stiffness Negative nuchal rigidity Negative cervical lymphadenopathy Negative meningeal signs  Cardiovascular: Normal rate, regular rhythm and normal heart sounds.  Exam reveals no friction rub.   No murmur heard. Pulses:      Radial pulses are 2+ on the right side, and 2+ on the left side.       Dorsalis pedis pulses are 2+ on the right side, and 2+ on the left side.  Refill less than 3 seconds Negative swelling or pitting edema identified lower extremities bilaterally  Pulmonary/Chest: Effort normal and breath sounds normal. No respiratory distress. She has no wheezes. She has no rales.  Patient is able to speak in full sentences without difficulty Negative use of accessory muscles Negative stridor Negative pain upon palpation to the chest wall  Musculoskeletal: Normal range of motion.  Full ROM to upper and lower extremities without difficulty noted, negative ataxia noted.  Lymphadenopathy:    She has no cervical adenopathy.  Neurological: She is alert and oriented to person, place, and time. No cranial nerve deficit. She exhibits normal muscle tone. Coordination normal.  Cranial nerves III-XII grossly intact Strength 5+/5+ to upper and lower extremities bilaterally with resistance applied, equal distribution noted Equal grip strength bilaterally Negative facial droop Negative surgeon speech Negative aphasia Patient is able to bring finger to nose bilaterally without difficulty-   Mild ataxia identified with motion to the right upper extremity Negative arm drift Fine motor skills intact  Skin: Skin is warm and dry. No rash noted. She is not diaphoretic. No erythema.  Psychiatric: She has a normal mood and affect. Her behavior is normal. Thought content normal.    ED Course  Procedures (including critical care  time)  Results for orders placed during the hospital encounter of 04/12/14  CBC WITH DIFFERENTIAL      Result Value Ref Range   WBC 5.4  4.0 - 10.5 K/uL   RBC 4.05  3.87 - 5.11 MIL/uL   Hemoglobin 11.8 (*) 12.0 - 15.0 g/dL   HCT 35.2 (*) 36.0 - 46.0 %   MCV 86.9  78.0 - 100.0 fL   MCH 29.1  26.0 - 34.0 pg   MCHC 33.5  30.0 - 36.0 g/dL   RDW 12.6  11.5 - 15.5 %   Platelets 166  150 - 400 K/uL   Neutrophils Relative % 62  43 - 77 %   Neutro Abs 3.3  1.7 - 7.7 K/uL   Lymphocytes Relative 31  12 - 46 %   Lymphs Abs 1.7  0.7 - 4.0 K/uL   Monocytes Relative 6  3 - 12 %   Monocytes Absolute 0.3  0.1 - 1.0 K/uL   Eosinophils Relative 1  0 - 5 %   Eosinophils Absolute 0.1  0.0 - 0.7 K/uL   Basophils Relative 0  0 - 1 %   Basophils Absolute 0.0  0.0 - 0.1 K/uL  COMPREHENSIVE METABOLIC PANEL      Result Value  Ref Range   Sodium 142  137 - 147 mEq/L   Potassium 4.1  3.7 - 5.3 mEq/L   Chloride 104  96 - 112 mEq/L   CO2 27  19 - 32 mEq/L   Glucose, Bld 96  70 - 99 mg/dL   BUN 19  6 - 23 mg/dL   Creatinine, Ser 0.73  0.50 - 1.10 mg/dL   Calcium 9.0  8.4 - 10.5 mg/dL   Total Protein 6.7  6.0 - 8.3 g/dL   Albumin 3.5  3.5 - 5.2 g/dL   AST 15  0 - 37 U/L   ALT 10  0 - 35 U/L   Alkaline Phosphatase 58  39 - 117 U/L   Total Bilirubin 0.3  0.3 - 1.2 mg/dL   GFR calc non Af Amer 88 (*) >90 mL/min   GFR calc Af Amer >90  >90 mL/min   Anion gap 11  5 - 15  TROPONIN I      Result Value Ref Range   Troponin I <0.30  <0.30 ng/mL  URINALYSIS, ROUTINE W REFLEX MICROSCOPIC      Result Value Ref Range   Color, Urine YELLOW  YELLOW   APPearance CLEAR  CLEAR   Specific Gravity, Urine 1.009  1.005 - 1.030   pH 6.0  5.0 - 8.0   Glucose, UA NEGATIVE  NEGATIVE mg/dL   Hgb urine dipstick NEGATIVE  NEGATIVE   Bilirubin Urine NEGATIVE  NEGATIVE   Ketones, ur NEGATIVE  NEGATIVE mg/dL   Protein, ur NEGATIVE  NEGATIVE mg/dL   Urobilinogen, UA 0.2  0.0 - 1.0 mg/dL   Nitrite NEGATIVE  NEGATIVE    Leukocytes, UA NEGATIVE  NEGATIVE    Labs Review Labs Reviewed  CBC WITH DIFFERENTIAL - Abnormal; Notable for the following:    Hemoglobin 11.8 (*)    HCT 35.2 (*)    All other components within normal limits  COMPREHENSIVE METABOLIC PANEL - Abnormal; Notable for the following:    GFR calc non Af Amer 88 (*)    All other components within normal limits  TROPONIN I  URINALYSIS, ROUTINE W REFLEX MICROSCOPIC  CK    Imaging Review Dg Chest Port 1 View  04/12/2014   CLINICAL DATA:  Syncopal episode today.  EXAM: PORTABLE CHEST - 1 VIEW  COMPARISON:  None.  FINDINGS: The cardiac silhouette, mediastinal and hilar contours are within normal limits given the AP projection and portable technique. The lungs are clear. The bony thorax is intact.  IMPRESSION: No acute cardiopulmonary findings.   Electronically Signed   By: Kalman Jewels M.D.   On: 04/12/2014 13:46     EKG Interpretation   Date/Time:  Monday April 12 2014 12:45:49 EDT Ventricular Rate:  70 PR Interval:  126 QRS Duration: 88 QT Interval:  448 QTC Calculation: 483 R Axis:   74 Text Interpretation:  Normal sinus rhythm Normal ECG No old tracing to  compare Confirmed by BELFI  MD, MELANIE ([id number]) on 04/12/2014 3:54:41 PM     5:17 PM This provider spoke with Dr. Ronnie Derby, [facility] - discussed case, labs, imaging, vitals, ED course in great detail. Patient to be admitted to hospital regarding syncopal episode with associated dizziness and headache.   MDM   Final diagnoses:  Syncope  Dizziness    Medications  sodium chloride 0.9 % bolus 1,000 mL (1,000 mLs Intravenous New Bag/Given 04/12/14 1329)   Filed Vitals:   04/12/14 1700 04/12/14 1715 04/12/14 1723 04/12/14 1745  BP: 147/70 148/69 115/51 127/62  Pulse: 70 78 78 69  Temp:      TempSrc:      Resp: 16 15 22 17   SpO2: 95% 95% 93% 96%    EKG noted normal sinus rhythm with a heart rate of 70 bpm. Troponin negative elevation. CBC unremarkable. CMP  unremarkable. Urinalysis negative for infection. Chest x-ray negative for acute cardio pulmonary disease. Doubt pneumonia. Doubt infectious process. Doubt UTI. Negative focal neurological deficits noted. Patient presenting to the ED with 2 syncopal episodes with associated headache/dizziness. Patient to be admitted for monitoring. Discussed case with [facility] - patient to be admitted for observation. Discussed plan for admission with patient who agrees to plan of care.   6:17 PM This provider was made aware that canopy is down - CT imaging is not crossing over to the radiologist. CT head not read.  6:47 PM This provider spoke with Dr. Ronnie Derby who reported that patient cannot be brought to the floor until CT head read. Patient to be kept in the ED setting until CT head results have been performed. Discussed this with Dr. Peggye Pitt. Transfer of care to Dr. Peggye Pitt. Discussed with Dr. Peggye Pitt that once CT head is read [facility] to be called and patient to be admitted to Telemetry floor.   Jamse Mead, PA-C 04/12/14 1836  Jamse Mead, PA-C 04/12/14 1853

## 2014-04-12 NOTE — ED Notes (Signed)
Portable xray at bedside.

## 2014-04-12 NOTE — H&P (Signed)
Patient Demographics  Amanda Molina, is a 63 y.o. female  MRN: [id number]   DOB - 07-23-1950  Admit Date - 04/12/2014  Outpatient Primary MD for the patient is Lucretia Kern., DO   With History of -  Past Medical History  Diagnosis Date  . Diabetes   . Hypertension   . Depression   . Diverticulitis   . Hyperlipidemia       Past Surgical History  Procedure Laterality Date  . Appendectomy      teenager  . Tonsillectomy      in for   Chief Complaint  Patient presents with  . Near Syncope     HPI  Amanda Molina  is a 63 y.o. female,  with history of type 2 diabetes mellitus under direct control, essential hypertension, depression, dyslipidemia who went to doctor's office with her husband for his physical checkup had an episode of lightheadedness at his doctor's office, she had a near complete loss of consciousness, her blood pressure was checked and her systolic was in 53Z, she was standing up when this happened, EKG was done in doctor's office which was unremarkable, patient did not have any preceding aura, no bowel or bladder incontinence, no seizure-like activity, no chest pain palpitations, no focal weakness.   In the ER her workup was consistent with orthostatic hypotension, lab work was unremarkable, EKG was unremarkable, head CT is pending and I was called to admit the patient for orthostatic hypotension. She has currently received some IV fluids and feels better. Currently negative review of systems. She does have history of some headaches and feeling not well for the last 1 week, she says this has started since she was placed on a statin medication one week ago.    Review of Systems Currently -ve  In addition to the HPI above,   No Fever-chills, No Headache, No changes with Vision  or hearing, No problems swallowing food or Liquids, No Chest pain, Cough or Shortness of Breath, No Abdominal pain, No Nausea or Vommitting, Bowel movements are regular, No Blood in stool or Urine, No dysuria, No new skin rashes or bruises, No new joints pains-aches,  No new weakness, tingling, numbness in any extremity, No recent weight gain or loss, No polyuria, polydypsia or polyphagia, No significant Mental Stressors.  A full 10 point Review of Systems was done, except as stated above, all other Review of Systems were negative.   Social History History  Substance Use Topics  . Smoking status: Former Research scientist (life sciences)  . Smokeless tobacco: Not on file  . Alcohol Use: Yes     Comment: rare- usually not when on medicaiton       Family History Family History  Problem Relation Age of Onset  . COPD Mother   . Heart disease Mother       Prior to Admission medications   Medication Sig Start Date End Date Taking? Authorizing Provider  citalopram (CELEXA) 20 MG tablet  Take 1.5 tablets (30 mg total) by mouth daily. 03/25/14  Yes Lucretia Kern, DO  FOLIC ACID PO Take by mouth daily.   Yes Historical Provider, MD  ibuprofen (ADVIL,MOTRIN) 200 MG tablet Take 400 mg by mouth every 6 (six) hours as needed for headache or mild pain.   Yes Historical Provider, MD  lisinopril-hydrochlorothiazide (PRINZIDE,ZESTORETIC) 10-12.5 MG per tablet Take 1 tablet by mouth daily. 09/17/13  Yes Lucretia Kern, DO  pravastatin (PRAVACHOL) 40 MG tablet Take 40 mg by mouth daily after lunch.   Yes Historical Provider, MD    Allergies  Allergen Reactions  . Sulfa Antibiotics     Child hood reaction    Physical Exam  Vitals  Blood pressure 115/51, pulse 78, temperature 98.7 F (37.1 C), temperature source Oral, resp. rate 22, SpO2 93.00%.   1. General middle aged obese white female lying in bed in NAD,    2. Normal affect and insight, Not Suicidal or Homicidal, Awake Alert, Oriented X 3.  3. No F.N  deficits, ALL C.Nerves Intact, Strength 5/5 all 4 extremities, Sensation intact all 4 extremities, Plantars down going.  4. Ears and Eyes appear Normal, Conjunctivae clear, PERRLA. Moist Oral Mucosa.  5. Supple Neck, No JVD, No cervical lymphadenopathy appriciated, No Carotid Bruits.  6. Symmetrical Chest wall movement, Good air movement bilaterally, CTAB.  7. RRR, No Gallops, Rubs or Murmurs, No Parasternal Heave.  8. Positive Bowel Sounds, Abdomen Soft, No tenderness, No organomegaly appriciated,No rebound -guarding or rigidity.  9.  No Cyanosis, Normal Skin Turgor, No Skin Rash or Bruise.  10. Good muscle tone,  joints appear normal , no effusions, Normal ROM.  11. No Palpable Lymph Nodes in Neck or Axillae    Data Review  CBC  Recent Labs Lab 04/12/14 1343  WBC 5.4  HGB 11.8*  HCT 35.2*  PLT 166  MCV 86.9  MCH 29.1  MCHC 33.5  RDW 12.6  LYMPHSABS 1.7  MONOABS 0.3  EOSABS 0.1  BASOSABS 0.0   ------------------------------------------------------------------------------------------------------------------  Chemistries   Recent Labs Lab 04/12/14 1343  NA 142  K 4.1  CL 104  CO2 27  GLUCOSE 96  BUN 19  CREATININE 0.73  CALCIUM 9.0  AST 15  ALT 10  ALKPHOS 58  BILITOT 0.3   ------------------------------------------------------------------------------------------------------------------ CrCl is unknown because both a height and weight (above a minimum accepted value) are required for this calculation. ------------------------------------------------------------------------------------------------------------------ No results found for this basename: TSH, T4TOTAL, FREET3, T3FREE, THYROIDAB,  in the last 72 hours   Coagulation profile No results found for this basename: INR, PROTIME,  in the last 168 hours ------------------------------------------------------------------------------------------------------------------- No results found for this  basename: DDIMER,  in the last 72 hours -------------------------------------------------------------------------------------------------------------------  Cardiac Enzymes  Recent Labs Lab 04/12/14 1343  TROPONINI <0.30   ------------------------------------------------------------------------------------------------------------------ No components found with this basename: POCBNP,    ---------------------------------------------------------------------------------------------------------------  Urinalysis    Component Value Date/Time   COLORURINE YELLOW 04/12/2014 [place] 04/12/2014 1319   LABSPEC 1.009 04/12/2014 1319   PHURINE 6.0 04/12/2014 1319   GLUCOSEU NEGATIVE 04/12/2014 1319   HGBUR NEGATIVE 04/12/2014 1319   BILIRUBINUR NEGATIVE 04/12/2014 1319   KETONESUR NEGATIVE 04/12/2014 1319   PROTEINUR NEGATIVE 04/12/2014 1319   UROBILINOGEN 0.2 04/12/2014 1319   NITRITE NEGATIVE 04/12/2014 [place] 04/12/2014 1319    ----------------------------------------------------------------------------------------------------------------  Imaging results:   Dg Chest Port 1 View  04/12/2014   CLINICAL DATA:  Syncopal episode today.  EXAM: PORTABLE  CHEST - 1 VIEW  COMPARISON:  None.  FINDINGS: The cardiac silhouette, mediastinal and hilar contours are within normal limits given the AP projection and portable technique. The lungs are clear. The bony thorax is intact.  IMPRESSION: No acute cardiopulmonary findings.   Electronically Signed   By: Kalman Jewels M.D.   On: 04/12/2014 13:46    My personal review of EKG: Rhythm NSR,  no Acute ST changes    Assessment & Plan   1. Syncope due to orthostatic hypotension likely caused by combination of HCTZ and ARB which she has been taking for a while. She will be admitted to a telemetry bed for 23 observation, IV fluid bolus and then maintenance, discontinue offending medication, monitor on  telemetry and cycle troponins. We will apply TED stockings and gradually increase activity.   2. Headaches, generalized discomfort for the last 1 week since she has been started on statin. Stop statin, check CK, check head CT. If all negative and headaches continue outpatient MRI.   3. Essential hypertension. Currently blood pressure low hold blood pressure medications.   4. Dyslipidemia. Held pravastatin due to #2 above, outpatient follow-up with PCP for new regimen.     DVT Prophylaxis Heparin   AM Labs Ordered, also please review Full Orders  Family Communication: Admission, patients condition and plan of care including tests being ordered have been discussed with the patient and indicates understanding and agree with the plan and Code Status.  Code Status Full  Likely DC to  Home  Condition Fair  Time spent in minutes : 35    Ashleynicole Mcclees K M.D on 04/12/2014 at 5:32 PM  Between 7am to 7pm - Pager - [phone]  After 7pm go to [email or web address] - password [email or web address]  And look for the night coverage person covering me after hours  [facility] Office  [phone]

## 2014-04-12 NOTE — ED Notes (Signed)
Pt. Was dropping husband off at PCP appointment and had a syncopal episode at office. Pt endorses general malaise and "not feeling right" for the past week after starting new statin drug. Pt endorses mild nausea, lightheadedness and dizziness with position changes. Pt endorses 2 episodes of sharp posterior headache last night but denies at present time. Pt. Is alert and oriented x4. Equal hand grips. No facial drooping, weakness, or limb ataxia noted. EOMs intact. Pt denies CP, ShOB, vomiting, diarrhea.   Nausea relieved by smell of alcohol pad. Pt sts smell "made me more awake and I feel a lot better now"

## 2014-04-12 NOTE — ED Notes (Signed)
Admitting MD at bedside.

## 2014-04-12 NOTE — ED Notes (Signed)
Attempted to call report to the floor, the nurse was unavailable for report at this time and will call back.

## 2014-04-12 NOTE — ED Notes (Signed)
Per admitting MD pt is not to be transported to floor until CT scan results are available, pt aware of plan of care

## 2014-04-13 DIAGNOSIS — R55 Syncope and collapse: Secondary | ICD-10-CM | POA: Diagnosis not present

## 2014-04-13 DIAGNOSIS — I10 Essential (primary) hypertension: Secondary | ICD-10-CM | POA: Diagnosis not present

## 2014-04-13 DIAGNOSIS — E089 Diabetes mellitus due to underlying condition without complications: Secondary | ICD-10-CM | POA: Diagnosis not present

## 2014-04-13 LAB — BASIC METABOLIC PANEL
Anion gap: 10 (ref 5–15)
BUN: 15 mg/dL (ref 6–23)
CHLORIDE: 104 meq/L (ref 96–112)
CO2: 29 meq/L (ref 19–32)
CREATININE: 0.81 mg/dL (ref 0.50–1.10)
Calcium: 8.9 mg/dL (ref 8.4–10.5)
GFR calc Af Amer: 86 mL/min — ABNORMAL LOW (ref >90)
GFR calc non Af Amer: 75 mL/min — ABNORMAL LOW (ref >90)
GLUCOSE: 105 mg/dL — AB (ref 70–99)
Potassium: 4.2 mEq/L (ref 3.7–5.3)
Sodium: 143 mEq/L (ref 137–147)

## 2014-04-13 LAB — CBC
HEMATOCRIT: 35 % — AB (ref 36.0–46.0)
HEMOGLOBIN: 11.6 g/dL — AB (ref 12.0–15.0)
MCH: 28.6 pg (ref 26.0–34.0)
MCHC: 33.1 g/dL (ref 30.0–36.0)
MCV: 86.4 fL (ref 78.0–100.0)
Platelets: 167 10*3/uL (ref 150–400)
RBC: 4.05 MIL/uL (ref 3.87–5.11)
RDW: 12.5 % (ref 11.5–15.5)
WBC: 5.6 10*3/uL (ref 4.0–10.5)

## 2014-04-13 LAB — TROPONIN I: Troponin I: <0.3 ng/mL (ref <0.30)

## 2014-04-13 LAB — GLUCOSE, CAPILLARY: GLUCOSE-CAPILLARY: 96 mg/dL (ref 70–99)

## 2014-04-13 MED ORDER — ALPRAZOLAM 0.25 MG PO TABS: 0.2500 mg | ORAL_TABLET | Freq: Once | ORAL | Status: DC | PRN

## 2014-04-13 NOTE — Discharge Summary (Signed)
Physician Discharge Summary  Amanda Molina VFI:[id number] DOB: March 20, 1951 DOA: 04/12/2014  PCP: Colin Benton R., DO  Admit date: 04/12/2014 Discharge date: 04/13/2014  Time spent: 35 minutes  Recommendations for Outpatient Follow-up:  1. Follow up with PCP in ~1 week  Recommendations for primary care physician for things to follow:  Outpatient MRI Monitor BP  Discharge Diagnoses:  Principal Problem:   Orthostatic hypotension Active Problems:   Diabetes   Hypertension   Depression   Syncopal episodes   Syncope   Discharge Condition: stable  Diet recommendation: regular   Filed Weights   04/12/14 2132 04/13/14 0400  Weight: 93.985 kg (207 lb 3.2 oz) 93.7 kg (206 lb 9.1 oz)    History of present illness:  Amanda Molina is a 63 y.o. female, with history of type 2 diabetes mellitus under direct control, essential hypertension, depression, dyslipidemia who went to doctor's office with her husband for his physical checkup had an episode of lightheadedness at his doctor's office, she had a near complete loss of consciousness, her blood pressure was checked and her systolic was in 41Y, she was standing up when this happened, EKG was done in doctor's office which was unremarkable, patient did not have any preceding aura, no bowel or bladder incontinence, no seizure-like activity, no chest pain palpitations, no focal weakness. In the ER her workup was consistent with orthostatic hypotension, lab work was unremarkable, EKG was unremarkable, head CT is pending and I was called to admit the patient for orthostatic hypotension. She has currently received some IV fluids and feels better. Currently negative review of systems. She does have history of some headaches and feeling not well for the last 1 week, she says this has started since she was placed on a statin medication one week ago.  Hospital Course:  Patient was admitted with syncope likely due to orthostatic hypotension,  likely caused by HCTZ/ACEI. She was monitored on telemetry, received IV hydration and her symptoms significantly improved. She underwent a CT scan of the head without significant findings. She was discharged home in stable condition and asked to follow up with her PCP in 1-2 weeks. Patient did endorse of a headache for the past 2 weeks which have been progressively worse and I would recommend an MRI as an outpatient. Her ACEI/HCTZ was discontinued, she was advised to stay adequately hydrated, and will need close outpatient follow up for BP monitoring.   Procedures:  None    Consultations:  None   Discharge Exam: Filed Vitals:   04/12/14 2000 04/12/14 2132 04/13/14 0400 04/13/14 0755  BP:  135/50    Pulse: 73 65    Temp:  98.6 F (37 C) 98.5 F (36.9 C) 97.9 F (36.6 C)  TempSrc:  Oral Oral Oral  Resp: 20 18    Height:  4\' 11"  (1.499 m)    Weight:  93.985 kg (207 lb 3.2 oz) 93.7 kg (206 lb 9.1 oz)   SpO2: 97% 97%  98%    General: NAD Cardiovascular: RRR Respiratory: CTA biL  Discharge Instructions     Medication List    STOP taking these medications       lisinopril-hydrochlorothiazide 10-12.5 MG per tablet  Commonly known as:  PRINZIDE,ZESTORETIC      TAKE these medications       citalopram 20 MG tablet  Commonly known as:  CELEXA  Take 1.5 tablets (30 mg total) by mouth daily.     FOLIC ACID PO  Take by mouth daily.  ibuprofen 200 MG tablet  Commonly known as:  ADVIL,MOTRIN  Take 400 mg by mouth every 6 (six) hours as needed for headache or mild pain.     pravastatin 40 MG tablet  Commonly known as:  PRAVACHOL  Take 40 mg by mouth daily after lunch.           Follow-up Information   Follow up with Colin Benton R., DO. Schedule an appointment as soon as possible for a visit in 1 week.   Specialty:  Family Medicine   Contact information:   [place] [address] [phone]       The results of significant diagnostics  from this hospitalization (including imaging, microbiology, ancillary and laboratory) are listed below for reference.    Significant Diagnostic Studies: Ct Head Wo Contrast  04/12/2014   CLINICAL DATA:  Posterior headache for 1 week. Multiple syncopal episodes. Initial encounter  EXAM: CT HEAD WITHOUT CONTRAST  TECHNIQUE: Contiguous axial images were obtained from the base of the skull through the vertex without intravenous contrast.  COMPARISON:  None.  FINDINGS: Skull and Sinuses:No acute fracture or destructive process. Advanced degeneration of the right temporomandibular joint with fragmentation or soft tissue mineralization. Calcification or chronic appearing foreign body in the left forehead.  Orbits: No acute abnormality.  Brain: No evidence of acute abnormality, such as acute infarction, hemorrhage, hydrocephalus, or mass lesion/mass effect.  IMPRESSION: Negative head CT.   Electronically Signed   By: Jorje Guild M.D.   On: 04/12/2014 20:20   Dg Chest Port 1 View  04/12/2014   CLINICAL DATA:  Syncopal episode today.  EXAM: PORTABLE CHEST - 1 VIEW  COMPARISON:  None.  FINDINGS: The cardiac silhouette, mediastinal and hilar contours are within normal limits given the AP projection and portable technique. The lungs are clear. The bony thorax is intact.  IMPRESSION: No acute cardiopulmonary findings.   Electronically Signed   By: Kalman Jewels M.D.   On: 04/12/2014 13:46    Microbiology: No results found for this or any previous visit (from the past 240 hour(s)).   Labs: Basic Metabolic Panel:  Recent Labs Lab 04/12/14 1343 04/12/14 2236 04/13/14 0329  NA 142  --  143  K 4.1  --  4.2  CL 104  --  104  CO2 27  --  29  GLUCOSE 96  --  105*  BUN 19  --  15  CREATININE 0.73 0.74 0.81  CALCIUM 9.0  --  8.9   Liver Function Tests:  Recent Labs Lab 04/12/14 1343  AST 15  ALT 10  ALKPHOS 58  BILITOT 0.3  PROT 6.7  ALBUMIN 3.5   CBC:  Recent Labs Lab 04/12/14 1343  04/12/14 2236 04/13/14 0329  WBC 5.4 5.7 5.6  NEUTROABS 3.3  --   --   HGB 11.8* 11.7* 11.6*  HCT 35.2* 35.5* 35.0*  MCV 86.9 85.1 86.4  PLT 166 173 167   Cardiac Enzymes:  Recent Labs Lab 04/12/14 1343 04/12/14 1728 04/12/14 2236 04/13/14 0329 04/13/14 0935  CKTOTAL  --  132  --   --   --   TROPONINI <0.30  --  <0.30 <0.30 <0.30   CBG:  Recent Labs Lab 04/12/14 2146  GLUCAP 96   Signed:  Caleb Prigmore  [facility] 04/13/2014, 3:02 PM

## 2014-04-13 NOTE — Discharge Instructions (Signed)

## 2014-04-14 NOTE — ED Provider Notes (Signed)
Medical screening examination/treatment/procedure(s) were performed by non-physician practitioner and as supervising physician I was immediately available for consultation/collaboration.   EKG Interpretation   Date/Time:  Monday April 12 2014 12:45:49 EDT Ventricular Rate:  70 PR Interval:  126 QRS Duration: 88 QT Interval:  448 QTC Calculation: 483 R Axis:   74 Text Interpretation:  Normal sinus rhythm Normal ECG No old tracing to  compare Confirmed by [place]  MD, Gayanne Prescott ([id number]) on 04/12/2014 3:54:41 PM        Malvin Johns, MD 04/14/14 [phone]

## 2014-04-23 ENCOUNTER — Ambulatory Visit
Admission: RE | Admit: 2014-04-23 | Discharge: 2014-04-23 | Disposition: A | Discharge status: Home / Self Care | Payer: Medicare Other | Source: Ambulatory Visit

## 2014-04-23 DIAGNOSIS — Z1231 Encounter for screening mammogram for malignant neoplasm of breast: Secondary | ICD-10-CM | POA: Diagnosis not present

## 2014-04-27 ENCOUNTER — Ambulatory Visit (INDEPENDENT_AMBULATORY_CARE_PROVIDER_SITE_OTHER): Payer: Medicare Other | Admitting: Family Medicine

## 2014-04-27 ENCOUNTER — Encounter: Payer: Self-pay | Admitting: Family Medicine

## 2014-04-27 VITALS — BP 136/82 | HR 82 | Temp 98.2°F | Ht 59.0 in | Wt 207.3 lb

## 2014-04-27 DIAGNOSIS — E119 Type 2 diabetes mellitus without complications: Secondary | ICD-10-CM | POA: Diagnosis not present

## 2014-04-27 DIAGNOSIS — I10 Essential (primary) hypertension: Secondary | ICD-10-CM | POA: Diagnosis not present

## 2014-04-27 DIAGNOSIS — F32A Depression, unspecified: Secondary | ICD-10-CM

## 2014-04-27 DIAGNOSIS — F329 Major depressive disorder, single episode, unspecified: Secondary | ICD-10-CM

## 2014-04-27 DIAGNOSIS — I951 Orthostatic hypotension: Secondary | ICD-10-CM

## 2014-04-27 NOTE — Progress Notes (Signed)
Pre visit review using our clinic review tool, if applicable. No additional management support is needed unless otherwise documented below in the visit note. 

## 2014-04-27 NOTE — Progress Notes (Signed)
HPI:  Follow up:  Hospitalized 10/26-10/27/15: -presented with presyncope/?syncope found to be secondary to orthostatic hypotension -her BP medication was held -per discharge notes EKG, labs, CT head all normal - however, on my review she has a mild anemia -patient reports: she feels great, reports HA completely resolved - she doe snot want to get an MRI as she is completely fine now -denies: persistent dizziness, any symptoms since, SOB, CP, DOE, vision changes, palpitations, HA  Depression: Of note, pt was on the schedule today to follow up for depression. She has been under a lot of stress with caring for her husband and is prone do depression this time of the year. -increased celexa last visit -reports: doing much better, she report her mood has been good despite everything going on -denies: thoughts of self harm depression  HLD: -not at goal and pravastatin added about 1 month ago -reports: she stopped this as she thought it might have caused the dizzziness      ROS: See pertinent positives and negatives per HPI.  Past Medical History  Diagnosis Date  . Diabetes   . Hypertension   . Depression   . Diverticulitis   . Hyperlipidemia     Past Surgical History  Procedure Laterality Date  . Appendectomy      teenager  . Tonsillectomy      Family History  Problem Relation Age of Onset  . COPD Mother   . Heart disease Mother     History   Social History  . Marital Status: Married    Spouse Name: N/A    Number of Children: N/A  . Years of Education: N/A   Social History Main Topics  . Smoking status: Former Research scientist (life sciences)  . Smokeless tobacco: None  . Alcohol Use: Yes     Comment: rare- usually not when on medicaiton   . Drug Use: None  . Sexual Activity: None   Other Topics Concern  . 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  GENERAL: vitals reviewed and listed above, alert, oriented, appears well hydrated and in no acute distress  HEENT: atraumatic, conjunttiva clear, no obvious abnormalities on inspection of external nose and ears  NECK: no obvious masses on inspection  LUNGS: clear to auscultation bilaterally, no wheezes, rales or rhonchi, good air movement  CV: HRRR, no peripheral edema  MS: moves all extremities without noticeable abnormality  PSYCH: pleasant and cooperative, no obvious depression or anxiety  ASSESSMENT AND PLAN:  Discussed the following assessment and plan:  Essential hypertension  Depression  Orthostatic hypotension  Type 2 diabetes mellitus without complication  -discussed her BP and she opted to hold off on restarting any medication -discussed MRI but she declined which is reasonable given no further symptoms -depression is much better -she  wants to hold off on restarting statin and try diet and exercise and follow up to recheck labs -Patient advised to return or notify a doctor immediately if symptoms worsen or persist or new concerns arise.  Patient Instructions  We recommend the following healthy lifestyle measures: - eat a healthy diet consisting of lots of vegetables, fruits, beans, nuts, seeds, healthy meats such as white chicken and fish and whole grains.  - avoid fried foods, fast food, processed foods, sodas, red meet and other fattening foods.  - get a least 150 minutes of aerobic exercise per week.   Follow up in  about 2 months, morning appt - come fasting     Lennyn Bellanca R.

## 2014-04-27 NOTE — Patient Instructions (Signed)
We recommend the following healthy lifestyle measures: - eat a healthy diet consisting of lots of vegetables, fruits, beans, nuts, seeds, healthy meats such as white chicken and fish and whole grains.  - avoid fried foods, fast food, processed foods, sodas, red meet and other fattening foods.  - get a least 150 minutes of aerobic exercise per week.   Follow up in about 2 months, morning appt - come fasting

## 2014-06-21 ENCOUNTER — Ambulatory Visit (INDEPENDENT_AMBULATORY_CARE_PROVIDER_SITE_OTHER)
Admission: RE | Admit: 2014-06-21 | Discharge: 2014-06-21 | Disposition: A | Discharge status: Home / Self Care | Payer: Medicare Other | Source: Ambulatory Visit | Attending: Family Medicine | Admitting: Family Medicine

## 2014-06-21 ENCOUNTER — Encounter: Payer: Self-pay | Admitting: Family Medicine

## 2014-06-21 ENCOUNTER — Ambulatory Visit (INDEPENDENT_AMBULATORY_CARE_PROVIDER_SITE_OTHER): Payer: Medicare Other | Admitting: Family Medicine

## 2014-06-21 VITALS — BP 112/78 | HR 100 | Temp 97.4°F | Ht 59.0 in

## 2014-06-21 DIAGNOSIS — M25571 Pain in right ankle and joints of right foot: Secondary | ICD-10-CM

## 2014-06-21 DIAGNOSIS — M7661 Achilles tendinitis, right leg: Secondary | ICD-10-CM | POA: Diagnosis not present

## 2014-06-21 MED ORDER — HYDROCODONE-ACETAMINOPHEN 5-325 MG PO TABS: ORAL_TABLET | ORAL | Status: DC

## 2014-06-21 MED ORDER — PRAVASTATIN SODIUM 20 MG PO TABS: 20.0000 mg | ORAL_TABLET | Freq: Every day | ORAL | Status: DC

## 2014-06-21 NOTE — Progress Notes (Signed)
  HPI:  Acute visit for:  1) Foot Pain: -started 4 days ago -location: heel, hurts worse when takes a step -symptoms: sharp pain in heel when walks -denies: trauma, weakness, numbness, fevers, swelling, malaise -used a few norco and this worked well - uses cautiously  ROS: See pertinent positives and negatives per HPI.  Past Medical History  Diagnosis Date  . Diabetes   . Hypertension   . Depression   . Diverticulitis   . Hyperlipidemia     Past Surgical History  Procedure Laterality Date  . Appendectomy      teenager  . Tonsillectomy      Family History  Problem Relation Age of Onset  . COPD Mother   . Heart disease Mother     History   Social History  . Marital Status: Married    Spouse Name: N/A    Number of Children: N/A  . Years of Education: N/A   Social History Main Topics  . Smoking status: Former Research scientist (life sciences)  . Smokeless tobacco: None  . Alcohol Use: Yes     Comment: rare- usually not when on medicaiton   . Drug Use: None  . Sexual Activity: None   Other Topics Concern  . 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 Use as little as possible., Disp: 10 tablet, Rfl: 0;  pravastatin (PRAVACHOL) 20 MG tablet, Take 1 tablet (20 mg total) by mouth daily., Disp: 90 tablet, Rfl: 3  EXAM:  Filed Vitals:   06/21/14 1505  BP: 112/78  Pulse: 100  Temp: 97.4 F (36.3 C)    Body mass index is 0.00 kg/(m^2).  GENERAL: vitals reviewed and listed above, alert, oriented, appears well hydrated and in no acute distress  HEENT: atraumatic, conjunttiva clear,  no obvious abnormalities on inspection of external nose and ears  NECK: no obvious masses on inspection  LUNGS: clear to auscultation bilaterally, no wheezes, rales or rhonchi, good air movement  CV: HRRR, no peripheral edema  MS: moves all extremities without noticeable abnormality, no swelling or erythema of foot compared to other side -TTP in plantar fascia of R heel and in post heel -normal ROM and strength in foot, no other bony TTP   PSYCH: pleasant and cooperative, no obvious depression or anxiety  ASSESSMENT AND PLAN:  Discussed the following assessment and plan:  Pain in joint, ankle and foot, right - Plan: pravastatin (PRAVACHOL) 20 MG tablet, DG Foot Complete Right, HYDROcodone-acetaminophen (NORCO/VICODIN) 5-325 MG per tablet  -likely plantar fasciitis -xray given post heel pain -str[place]burg sock and pain controll, risks discussed -Patient advised to return or notify a doctor immediately if symptoms worsen or persist or new concerns arise.  Patient Instructions  BEFORE YOU LEAVE: -go get xray -keep follow up as scheduled  Tylenol 500-1000mg  up to 3 times daily as needed  Use the norco only for severe pain and use as little as needed  [place] sock - wear nightly, can get on [place] Maxin, [place] R.

## 2014-06-21 NOTE — Progress Notes (Signed)
Pre visit review using our clinic review tool, if applicable. No additional management support is needed unless otherwise documented below in the visit note. 

## 2014-06-21 NOTE — Patient Instructions (Addendum)
BEFORE YOU LEAVE: -go get xray -keep follow up as scheduled  Tylenol 500-1000mg  up to 3 times daily as needed  Use the norco only for severe pain and use as little as needed  [place] sock - wear nightly, can get on [place]

## 2014-06-28 ENCOUNTER — Ambulatory Visit (INDEPENDENT_AMBULATORY_CARE_PROVIDER_SITE_OTHER): Payer: Medicare Other | Admitting: Family Medicine

## 2014-06-28 ENCOUNTER — Encounter: Payer: Self-pay | Admitting: Family Medicine

## 2014-06-28 VITALS — BP 130/82 | HR 75 | Temp 98.1°F | Ht 59.0 in | Wt 207.3 lb

## 2014-06-28 DIAGNOSIS — M25571 Pain in right ankle and joints of right foot: Secondary | ICD-10-CM | POA: Diagnosis not present

## 2014-06-28 DIAGNOSIS — F329 Major depressive disorder, single episode, unspecified: Secondary | ICD-10-CM | POA: Diagnosis not present

## 2014-06-28 DIAGNOSIS — I159 Secondary hypertension, unspecified: Secondary | ICD-10-CM | POA: Diagnosis not present

## 2014-06-28 DIAGNOSIS — I951 Orthostatic hypotension: Secondary | ICD-10-CM | POA: Diagnosis not present

## 2014-06-28 DIAGNOSIS — E785 Hyperlipidemia, unspecified: Secondary | ICD-10-CM | POA: Diagnosis not present

## 2014-06-28 DIAGNOSIS — R739 Hyperglycemia, unspecified: Secondary | ICD-10-CM

## 2014-06-28 DIAGNOSIS — F32A Depression, unspecified: Secondary | ICD-10-CM

## 2014-06-28 LAB — BASIC METABOLIC PANEL
BUN: 14 mg/dL (ref 6–23)
CALCIUM: 9.4 mg/dL (ref 8.4–10.5)
CO2: 29 mEq/L (ref 19–32)
CREATININE: 0.7 mg/dL (ref 0.4–1.2)
Chloride: 103 mEq/L (ref 96–112)
GFR: 84.81 mL/min (ref >60.00)
GLUCOSE: 98 mg/dL (ref 70–99)
POTASSIUM: 4.4 meq/L (ref 3.5–5.1)
Sodium: 141 mEq/L (ref 135–145)

## 2014-06-28 LAB — LIPID PANEL
CHOL/HDL RATIO: 3
Cholesterol: 172 mg/dL (ref 0–200)
HDL: 60.5 mg/dL (ref >39.00)
LDL Cholesterol: 85 mg/dL (ref 0–99)
NONHDL: 111.5
TRIGLYCERIDES: 133 mg/dL (ref 0.0–149.0)
VLDL: 26.6 mg/dL (ref 0.0–40.0)

## 2014-06-28 LAB — HEMOGLOBIN A1C: Hgb A1c MFr Bld: 6.3 % (ref 4.6–6.5)

## 2014-06-28 NOTE — Patient Instructions (Addendum)
BEFORE YOU LEAVE: -labs -schedule follow up in 3 months  We recommend the following healthy lifestyle measures: - eat a healthy diet consisting of lots of vegetables, fruits, beans, nuts, seeds, healthy meats such as white chicken and fish and whole grains.  - avoid fried foods, fast food, processed foods, sodas, red meet and other fattening foods.  - get a least 150 minutes of aerobic exercise per week.   For the foot: -ease out of the boot -call if you want to see the sports medicine doctor

## 2014-06-28 NOTE — Progress Notes (Addendum)
HPI:  HTN: -had hypotensive/presyncopal episode 03/2014 and meds stopped -mild LE edema after stopping hctz -denies: HTN, CP, SOB, DOE   Depression: -She has been under a lot of stress with caring for her husband and is prone do depression this time of the year. -increased celexa 03/2014 to 30mg  daily -reports: doing much better, she report her mood has been good despite everything going on -denies: thoughts of self harm depression  HLD/borderline diabetes: -cholesterol not at goal and pravastatin added 03/2014 - but made her dizzy -diet and exercise: not doing any exercise and diet is poor -denies: polyuria, polydipsia, leg cramps, cog decline on this medication  Foot Pain: -R heel -eval 05/2014 and achilles tendonopathy on xray along with OA food -reports: foot feels much better, not doing much for it but did wear an aircast for a week and felt better - but taking it out to do alphabet exercises -denies: weakness, numbness, swelling, erythema  ROS: See pertinent positives and negatives per HPI.  Past Medical History  Diagnosis Date  . Diabetes   . Hypertension   . Depression   . Diverticulitis   . Hyperlipidemia     Past Surgical History  Procedure Laterality Date  . Appendectomy      teenager  . Tonsillectomy      Family History  Problem Relation Age of Onset  . COPD Mother   . Heart disease Mother     History   Social History  . Marital Status: Married    Spouse Name: N/A    Number of Children: N/A  . Years of Education: N/A   Social History Main Topics  . Smoking status: Former Research scientist (life sciences)  . Smokeless tobacco: None  . Alcohol Use: Yes     Comment: rare- usually not when on medicaiton   . Drug Use: None  . Sexual Activity: None   Other Topics Concern  . 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      Spiritual Beliefs: Christian      Lifestyle: not good now              Current outpatient prescriptions: citalopram (CELEXA) 20 MG tablet, Take 1.5 tablets (30 mg total) by mouth daily., Disp: 45 tablet, Rfl: 3;  pravastatin (PRAVACHOL) 20 MG tablet, Take 1 tablet (20 mg total) by mouth daily., Disp: 90 tablet, Rfl: 3;  acetaminophen (TYLENOL) 500 MG tablet, Take 1,000 mg by mouth as needed., Disp: , Rfl:  HYDROcodone-acetaminophen (NORCO/VICODIN) 5-325 MG per tablet, Every 12 hours as needed for severe pain. Use as little as possible. (Patient not taking: Reported on 06/28/2014), Disp: 10 tablet, Rfl: 0  EXAM:  Filed Vitals:   06/28/14 1010  BP: 130/82  Pulse: 75  Temp: 98.1 F (36.7 C)    Body mass index is 41.85 kg/(m^2).  GENERAL: vitals reviewed and listed above, alert, oriented, appears well hydrated and in no acute distress  HEENT: atraumatic, conjunttiva clear, no obvious abnormalities on inspection of external nose and ears  NECK: no obvious masses on inspection  LUNGS: clear to auscultation bilaterally, no wheezes, rales or rhonchi, good air movement  CV: HRRR, no peripheral edema  MS: moves all extremities without noticeable abnormality; gait normal, ROM normal in feet, pedal pusles normal, no erythema or sig swelling, TTP mild and improved R plantar fascia and achilles tendon  PSYCH: pleasant and cooperative, no obvious depression or  anxiety  ASSESSMENT AND PLAN:  Discussed the following assessment and plan:  Pain in joint, ankle and foot, right  Depression  Secondary hypertension, unspecified  Hyperglycemia - Plan: Hemoglobin A1c  Orthostatic hypotension - Plan: Basic Metabolic Panel  Hyperlipemia - Plan: Lipid Panel  -discussed options for her foot and she is doing much better and will call if worsens, recurs, persistent and will have her see sports med -advised fasting labs -advised healthy diet and regular exercise -Patient advised to return or notify a doctor immediately if symptoms worsen or persist or new  concerns arise.  Patient Instructions  BEFORE YOU LEAVE: -labs -schedule follow up in 3 months  We recommend the following healthy lifestyle measures: - eat a healthy diet consisting of lots of vegetables, fruits, beans, nuts, seeds, healthy meats such as white chicken and fish and whole grains.  - avoid fried foods, fast food, processed foods, sodas, red meet and other fattening foods.  - get a least 150 minutes of aerobic exercise per week.   For the foot: -ease out of the boot -call if you want to see the sports medicine doctor     Amanda Molina.

## 2014-06-28 NOTE — Progress Notes (Signed)
Pre visit review using our clinic review tool, if applicable. No additional management support is needed unless otherwise documented below in the visit note. 

## 2014-07-30 ENCOUNTER — Other Ambulatory Visit: Payer: Self-pay | Admitting: Family Medicine

## 2014-08-09 ENCOUNTER — Telehealth: Payer: Self-pay | Admitting: Family Medicine

## 2014-08-09 DIAGNOSIS — M25571 Pain in right ankle and joints of right foot: Secondary | ICD-10-CM

## 2014-08-09 NOTE — Telephone Encounter (Signed)
Referral placed.

## 2014-08-09 NOTE — Telephone Encounter (Signed)
Patient would like to proceed with the referral to sports medicine.

## 2014-08-10 MED ORDER — HYDROCODONE-ACETAMINOPHEN 5-325 MG PO TABS: ORAL_TABLET | ORAL | Status: DC

## 2014-08-10 NOTE — Telephone Encounter (Signed)
I called the pt and she is aware the Rx was left at the front desk for her to pick up.

## 2014-08-10 NOTE — Telephone Encounter (Signed)
Patient informed and states she was given appt for 3/8 with Dr Tamala Julian and requests a refill on Hydrocodone until this visit?

## 2014-08-10 NOTE — Addendum Note (Signed)
Addended by: Agnes Lawrence on: 08/10/2014 01:31 PM   Modules accepted: Orders

## 2014-08-10 NOTE — Telephone Encounter (Signed)
Ok to do the norco, #10, 0 refills to use for the bad days.

## 2014-08-24 ENCOUNTER — Ambulatory Visit (INDEPENDENT_AMBULATORY_CARE_PROVIDER_SITE_OTHER): Payer: Medicare Other | Admitting: Family Medicine

## 2014-08-24 ENCOUNTER — Encounter: Payer: Self-pay | Admitting: Family Medicine

## 2014-08-24 ENCOUNTER — Other Ambulatory Visit (INDEPENDENT_AMBULATORY_CARE_PROVIDER_SITE_OTHER): Payer: Medicare Other

## 2014-08-24 VITALS — BP 152/80 | HR 87 | Ht 59.0 in | Wt 207.0 lb

## 2014-08-24 DIAGNOSIS — M79671 Pain in right foot: Secondary | ICD-10-CM

## 2014-08-24 DIAGNOSIS — M1A0710 Idiopathic chronic gout, right ankle and foot, without tophus (tophi): Secondary | ICD-10-CM

## 2014-08-24 DIAGNOSIS — M1A9XX0 Chronic gout, unspecified, without tophus (tophi): Secondary | ICD-10-CM

## 2014-08-24 DIAGNOSIS — S9304XA Dislocation of right ankle joint, initial encounter: Secondary | ICD-10-CM | POA: Diagnosis not present

## 2014-08-24 DIAGNOSIS — IMO0001 Reserved for inherently not codable concepts without codable children: Secondary | ICD-10-CM

## 2014-08-24 DIAGNOSIS — S93331A Other subluxation of right foot, initial encounter: Secondary | ICD-10-CM | POA: Insufficient documentation

## 2014-08-24 NOTE — Progress Notes (Signed)
Pre visit review using our clinic review tool, if applicable. No additional management support is needed unless otherwise documented below in the visit note. 

## 2014-08-24 NOTE — Patient Instructions (Signed)
Good to meet you Ice 10 minutes 2 times a day especially before bed.  Wear brace daily for at least 2 weeks.  Exercises 3 times a week.  Tyr pennsaid twice daily Try to change your diet with the following.  See me again in 3 weeks.    Gout Gout is an inflammatory arthritis caused by a buildup of uric acid crystals in the joints. Uric acid is a chemical that is normally present in the blood. When the level of uric acid in the blood is too high it can form crystals that deposit in your joints and tissues. This causes joint redness, soreness, and swelling (inflammation). Repeat attacks are common. Over time, uric acid crystals can form into masses (tophi) near a joint, destroying bone and causing disfigurement. Gout is treatable and often preventable. CAUSES  The disease begins with elevated levels of uric acid in the blood. Uric acid is produced by your body when it breaks down a naturally found substance called purines. Certain foods you eat, such as meats and fish, contain high amounts of purines. Causes of an elevated uric acid level include:  Being passed down from parent to child (heredity).  Diseases that cause increased uric acid production (such as obesity, psoriasis, and certain cancers).  Excessive alcohol use.  Diet, especially diets rich in meat and seafood.  Medicines, including certain cancer-fighting medicines (chemotherapy), water pills (diuretics), and aspirin.  Chronic kidney disease. The kidneys are no longer able to remove uric acid well.  Problems with metabolism. Conditions strongly associated with gout include:  Obesity.  High blood pressure.  High cholesterol.  Diabetes. Not everyone with elevated uric acid levels gets gout. It is not understood why some people get gout and others do not. Surgery, joint injury, and eating too much of certain foods are some of the factors that can lead to gout attacks. SYMPTOMS   An attack of gout comes on quickly. It  causes intense pain with redness, swelling, and warmth in a joint.  Fever can occur.  Often, only one joint is involved. Certain joints are more commonly involved:  Base of the big toe.  Knee.  Ankle.  Wrist.  Finger. Without treatment, an attack usually goes away in a few days to weeks. Between attacks, you usually will not have symptoms, which is different from many other forms of arthritis. DIAGNOSIS  Your caregiver will suspect gout based on your symptoms and exam. In some cases, tests may be recommended. The tests may include:  Blood tests.  Urine tests.  X-rays.  Joint fluid exam. This exam requires a needle to remove fluid from the joint (arthrocentesis). Using a microscope, gout is confirmed when uric acid crystals are seen in the joint fluid. TREATMENT  There are two phases to gout treatment: treating the sudden onset (acute) attack and preventing attacks (prophylaxis).  Treatment of an Acute Attack.  Medicines are used. These include anti-inflammatory medicines or steroid medicines.  An injection of steroid medicine into the affected joint is sometimes necessary.  The painful joint is rested. Movement can worsen the arthritis.  You may use warm or cold treatments on painful joints, depending which works best for you.  Treatment to Prevent Attacks.  If you suffer from frequent gout attacks, your caregiver may advise preventive medicine. These medicines are started after the acute attack subsides. These medicines either help your kidneys eliminate uric acid from your body or decrease your uric acid production. You may need to stay on these medicines  for a very long time.  The early phase of treatment with preventive medicine can be associated with an increase in acute gout attacks. For this reason, during the first few months of treatment, your caregiver may also advise you to take medicines usually used for acute gout treatment. Be sure you understand your  caregiver's directions. Your caregiver may make several adjustments to your medicine dose before these medicines are effective.  Discuss dietary treatment with your caregiver or dietitian. Alcohol and drinks high in sugar and fructose and foods such as meat, poultry, and seafood can increase uric acid levels. Your caregiver or dietitian can advise you on drinks and foods that should be limited. HOME CARE INSTRUCTIONS   Do not take aspirin to relieve pain. This raises uric acid levels.  Only take over-the-counter or prescription medicines for pain, discomfort, or fever as directed by your caregiver.  Rest the joint as much as possible. When in bed, keep sheets and blankets off painful areas.  Keep the affected joint raised (elevated).  Apply warm or cold treatments to painful joints. Use of warm or cold treatments depends on which works best for you.  Use crutches if the painful joint is in your leg.  Drink enough fluids to keep your urine clear or pale yellow. This helps your body get rid of uric acid. Limit alcohol, sugary drinks, and fructose drinks.  Follow your dietary instructions. Pay careful attention to the amount of protein you eat. Your daily diet should emphasize fruits, vegetables, whole grains, and fat-free or low-fat milk products. Discuss the use of coffee, vitamin C, and cherries with your caregiver or dietitian. These may be helpful in lowering uric acid levels.  Maintain a healthy body weight. SEEK MEDICAL CARE IF:   You develop diarrhea, vomiting, or any side effects from medicines.  You do not feel better in 24 hours, or you are getting worse. SEEK IMMEDIATE MEDICAL CARE IF:   Your joint becomes suddenly more tender, and you have chills or a fever. MAKE SURE YOU:   Understand these instructions.  Will watch your condition.  Will get help right away if you are not doing well or get worse. Document Released: 06/01/2000 Document Revised: 10/19/2013 Document  Reviewed: 01/16/2012 [facility] Patient Information 2015 [place], [place]. This information is not intended to replace advice given to you by your health care provider. Make sure you discuss any questions you have with your health care provider.

## 2014-08-24 NOTE — Assessment & Plan Note (Signed)
Patient's peroneal still do some subluxation and neck appears that the retinaculum is damage. Patient was put in an Aircast again today to avoid any lateral translation. We discussed icing regimen, topical antibiotic ointment was, and over-the-counter natural supplementations. Patient did work with her Product/process development scientist today to learn home exercises in greater detail. We discussed that this should improve considerably especially when patient is put in proper shoes. Patient may be a candidate for custom orthotics if this continues. Patient and will come back and see me again in 3 weeks for further evaluation and treatment.

## 2014-08-24 NOTE — Assessment & Plan Note (Signed)
Patient given some information about the potential for gout. Patient is not on any medication at this time. Patient try to make diet changes and see if this will be more beneficial.

## 2014-08-24 NOTE — Progress Notes (Signed)
[facility] [address] [place], [place] [address] Phone: [phone] Subjective:    I'm seeing this patient by the request  of:  Lucretia Kern., DO   CC: Right ankle pain  [id number] Amanda Molina is a 64 y.o. female coming in with complaint of right ankle pain. Patient has been seen by primary care provider on multiple occasions for right ankle and right heel pain. Patient has been diagnosed with Achilles tendinopathy on x-ray as well as tried some conservative therapy. Patient did attempt to try to wear an Aircast without any significant benefit. Patient continued to have significant pain where patient was unable to walk. Patient has had swelling in joint before and may have had a history of gout but never has been on any medications. Patient states over the course of the last week she is slowly improved with her ankle pain. Patient though has been rolling her ankle on a fairly regular basis. Patient feels like the outside of her ankle is more painful than the posterior aspect of her ankle. States that it hurts worse with ambulation and better with rest. Patient denies any significant injury that she can think of at this time. Patient rates the severity of pain a 7 out of 10 when it occurs. Patient states now the pain as 3 out of 10. Denies nighttime awakenings or any numbness or weakness.     Past medical history, social, surgical and family history all reviewed in electronic medical record.   Review of Systems: No headache, visual changes, nausea, vomiting, diarrhea, constipation, dizziness, abdominal pain, skin rash, fevers, chills, night sweats, weight loss, swollen lymph nodes, body aches, joint swelling, muscle aches, chest pain, shortness of breath, mood changes.   Objective Blood pressure 152/80, pulse 87, height 4\' 11"  (1.499 m), weight 207 lb (93.895 kg), SpO2 97 %.  General: No apparent distress alert and oriented x3 mood and affect normal,  dressed appropriately. Obese HEENT: Pupils equal, extraocular movements intact  Respiratory: Patient's speak in full sentences and does not appear short of breath  Cardiovascular: No lower extremity edema, non tender, no erythema  Skin: Warm dry intact with no signs of infection or rash on extremities or on axial skeleton.  Abdomen: Soft nontender  Neuro: Cranial nerves II through XII are intact, neurovascularly intact in all extremities with 2+ DTRs and 2+ pulses.  Lymph: No lymphadenopathy of posterior or anterior cervical chain or axillae bilaterally.  Gait antalgic gait with severe supination of the lateral aspect of the hindfoot.  MSK:  Non tender with full range of motion and good stability and symmetric strength and tone of shoulders, elbows, wrist, hip, knee and ankles bilaterally. Osteophytic changes of multiple joints  Ankle: Right ankle Significant lateral swelling noted Range of motion is full in all directions. Strength is 5/5 in all directions. Stable lateral and medial ligaments; squeeze test and kleiger test unremarkable; Talar dome nontender; No pain at base of 5th MT; No tenderness over cuboid; No tenderness over N spot or navicular prominence No tenderness on posterior aspects of lateral and medial malleolus Positive peroneal tendon subluxation with supination. Patient is painful up the peroneal tendons Negative tarsal tunnel tinel's Able to walk 4 steps. Contralateral ankle unremarkable  MSK US performed of: Right ankle This study was ordered, performed, and interpreted by Charlann Boxer D.O.  Foot/Ankle:   All structures visualized.   Talar dome unremarkable  Ankle mortise without effusion. Peroneus longus tendon does have significant hypoechoic  changes within the tendon sheath as well as has what appears to be a tear with increasing Doppler flow just to the inferior posterior aspect of the lateral malleolus. Lateral malleolus looks relatively unremarkable. Posterior  tibialis, flexor hallucis longus, and flexor digitorum longus tendons unremarkable on long and transverse views without sheath effusions. Achilles tendon does have calcific changes as well as potentially uric acid deposits noted. Anterior Talofibular Ligament and Calcaneofibular Ligaments unremarkable and intact. Deltoid Ligament unremarkable and intact. Plantar fascia intact and without effusion, normal thickness. No increased doppler signal, cap sign, or thickening of tibial cortex. Power doppler signal normal.  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  Patient's previous labs were reviewed today in patient's labs showed patient's uric acid was 5.3  Patient also had x-rays that did show calcific Achilles tendinopathy.  Procedure note 16109; 15 minutes spent for Therapeutic exercises as stated in above notes.  This included exercises focusing on stretching, strengthening, with significant focus on eccentric aspects.   Proper technique shown and discussed handout in great detail with ATC.  All questions were discussed and answered.     Impression and Recommendations:     This case required medical decision making of moderate complexity.

## 2014-09-02 ENCOUNTER — Telehealth: Payer: Self-pay | Admitting: Family Medicine

## 2014-09-14 ENCOUNTER — Ambulatory Visit (INDEPENDENT_AMBULATORY_CARE_PROVIDER_SITE_OTHER): Payer: Medicare Other | Admitting: Family Medicine

## 2014-09-14 ENCOUNTER — Encounter: Payer: Self-pay | Admitting: Family Medicine

## 2014-09-14 VITALS — BP 124/78 | HR 87 | Ht 59.0 in | Wt 207.0 lb

## 2014-09-14 DIAGNOSIS — IMO0001 Reserved for inherently not codable concepts without codable children: Secondary | ICD-10-CM

## 2014-09-14 DIAGNOSIS — S9304XD Dislocation of right ankle joint, subsequent encounter: Secondary | ICD-10-CM | POA: Diagnosis not present

## 2014-09-14 NOTE — Progress Notes (Signed)
Pre visit review using our clinic review tool, if applicable. No additional management support is needed unless otherwise documented below in the visit note. 

## 2014-09-14 NOTE — Patient Instructions (Signed)
Very good to see you Ice at night  Continue the exercises 3 times a week for another 6 weeks.  Consider orthotics and we can schedule you at a later date Vitamin D 2000 IU daily See me again 6 weeks if not perfect

## 2014-09-14 NOTE — Progress Notes (Signed)
[facility] [place], [place] [address] Phone: [phone] Subjective:    I'm seeing this patient by the request  of:  Lucretia Kern., DO   CC: Right ankle pain follow-up  [id number] Solina Heron is a 64 y.o. female coming in with complaint of right ankle pain. Patient was seen previously and had more of an Achilles tendinosis as well as a peroneal tendinitis. Patient states she is approximately 95% better. Patient still has some mild discomfort on the posterior aspect of the heel. Patient continues to wear the brace on a fairly regular basis. States that the lateral aspect of the heel though is much better. Patient has actually gotten new shoes which also has been helpful. Doing the icing and home exercises fairly regularly. Patient is very happy with the results.     Past medical history, social, surgical and family history all reviewed in electronic medical record.   Review of Systems: No headache, visual changes, nausea, vomiting, diarrhea, constipation, dizziness, abdominal pain, skin rash, fevers, chills, night sweats, weight loss, swollen lymph nodes, body aches, joint swelling, muscle aches, chest pain, shortness of breath, mood changes.   Objective Blood pressure 124/78, pulse 87, height 4\' 11"  (1.499 m), weight 207 lb (93.895 kg), SpO2 95 %.  General: No apparent distress alert and oriented x3 mood and affect normal, dressed appropriately. Obese HEENT: Pupils equal, extraocular movements intact  Respiratory: Patient's speak in full sentences and does not appear short of breath  Cardiovascular: No lower extremity edema, non tender, no erythema  Skin: Warm dry intact with no signs of infection or rash on extremities or on axial skeleton.  Abdomen: Soft nontender  Neuro: Cranial nerves II through XII are intact, neurovascularly intact in all extremities with 2+ DTRs and 2+ pulses.  Lymph: No lymphadenopathy of posterior or  anterior cervical chain or axillae bilaterally.  Gait antalgic gait with severe supination of the lateral aspect of the hindfoot.  MSK:  Non tender with full range of motion and good stability and symmetric strength and tone of shoulders, elbows, wrist, hip, knee and ankles bilaterally. Osteophytic changes of multiple joints  Ankle: Right ankle Significant lateral swelling noted Range of motion is full in all directions. Strength is 5/5 in all directions. Stable lateral and medial ligaments; squeeze test and kleiger test unremarkable; Talar dome nontender; No pain at base of 5th MT; No tenderness over cuboid; No tenderness over N spot or navicular prominence No tenderness on posterior aspects of lateral and medial malleolus Patient significantly less tender to palpation from previous exam over the lateral aspect of the ankle. Patient still minimally tender over the Achilles tendon insertion. Negative tarsal tunnel tinel's Able to walk 4 steps. Contralateral ankle unremarkable  MSK US performed of: Right ankle This study was ordered, performed, and interpreted by Charlann Boxer D.O.  Foot/Ankle:   All structures visualized.   Talar dome unremarkable  Ankle mortise without effusion. Peroneus longus tendon significantly less hypoechoic changes from previous exam.. Lateral malleolus looks relatively unremarkable. Posterior tibialis, flexor hallucis longus, and flexor digitorum longus tendons unremarkable on long and transverse views without sheath effusions. Achilles tendon has 80% decrease in calcific Anterior Talofibular Ligament and Calcaneofibular Ligaments unremarkable and intact. Deltoid Ligament unremarkable and intact. Plantar fascia intact and without effusion, normal thickness. No increased doppler signal, cap sign, or thickening of tibial cortex. Power doppler signal normal.  IMPRESSION: Significant improvement with peroneal tendinitis and less calcific changes of the Achilles  tendinosis.        Impression and Recommendations:     This case required medical decision making of moderate complexity.

## 2014-09-14 NOTE — Assessment & Plan Note (Signed)
Patient is doing significantly better at this time. We discussed icing regimen and home exercises. We discussed continuing the topical anti-inflammatories as needed. Do believe the patient is to continue to do well patient was given phase II strengthening exercises. We discussed vitamin D supplementation to help with some of the calcific changes of the Achilles. I do not think that patient will need the lateral brace at this time. Patient will come back and see me again in 3-6 weeks for further evaluation.  Spent  25 minutes with patient face-to-face and had greater than 50% of counseling including as described above in assessment and plan.

## 2014-09-23 NOTE — Telephone Encounter (Signed)
Contacted regarding foot pain by husband. Spoke with Dr. Tamala Julian regarding recommended treatment

## 2014-09-27 ENCOUNTER — Ambulatory Visit: Payer: Medicare Other | Admitting: Family Medicine

## 2014-10-22 ENCOUNTER — Telehealth: Payer: Self-pay | Admitting: Family Medicine

## 2014-10-22 NOTE — Telephone Encounter (Signed)
Patient states she would like to set something up for inserts.  Does not want anything right away.

## 2014-10-25 NOTE — Telephone Encounter (Signed)
I am ok with that tell her we need shoe size and I would put her in the comfort for the peroneal tenodonitis.

## 2014-10-25 NOTE — Telephone Encounter (Signed)
Left message for patient to call back to set-up time to do orthotics, probably looking at the week of May 16th.

## 2014-10-26 ENCOUNTER — Ambulatory Visit: Payer: Medicare Other | Admitting: Family Medicine

## 2014-11-25 ENCOUNTER — Other Ambulatory Visit: Payer: Self-pay | Admitting: Family Medicine

## 2015-04-22 ENCOUNTER — Other Ambulatory Visit: Payer: Self-pay

## 2015-04-22 DIAGNOSIS — Z1231 Encounter for screening mammogram for malignant neoplasm of breast: Secondary | ICD-10-CM

## 2015-05-07 DIAGNOSIS — Z23 Encounter for immunization: Secondary | ICD-10-CM | POA: Diagnosis not present

## 2015-05-25 ENCOUNTER — Ambulatory Visit
Admission: RE | Admit: 2015-05-25 | Discharge: 2015-05-25 | Disposition: A | Discharge status: Home / Self Care | Payer: Medicare Other | Source: Ambulatory Visit

## 2015-05-25 DIAGNOSIS — Z1231 Encounter for screening mammogram for malignant neoplasm of breast: Secondary | ICD-10-CM

## 2015-06-15 ENCOUNTER — Encounter: Payer: Self-pay | Admitting: *Deleted

## 2015-07-30 ENCOUNTER — Emergency Department (HOSPITAL_COMMUNITY): Payer: Medicare Other

## 2015-07-30 ENCOUNTER — Encounter (HOSPITAL_COMMUNITY): Payer: Self-pay | Admitting: Emergency Medicine

## 2015-07-30 ENCOUNTER — Emergency Department (HOSPITAL_COMMUNITY)
Admission: EM | Admit: 2015-07-30 | Discharge: 2015-07-30 | Disposition: A | Discharge status: Home / Self Care | Payer: Medicare Other | Attending: Emergency Medicine | Admitting: Emergency Medicine

## 2015-07-30 DIAGNOSIS — R079 Chest pain, unspecified: Secondary | ICD-10-CM | POA: Insufficient documentation

## 2015-07-30 DIAGNOSIS — Z87891 Personal history of nicotine dependence: Secondary | ICD-10-CM | POA: Diagnosis not present

## 2015-07-30 DIAGNOSIS — Z79899 Other long term (current) drug therapy: Secondary | ICD-10-CM | POA: Diagnosis not present

## 2015-07-30 DIAGNOSIS — F329 Major depressive disorder, single episode, unspecified: Secondary | ICD-10-CM | POA: Diagnosis not present

## 2015-07-30 DIAGNOSIS — E785 Hyperlipidemia, unspecified: Secondary | ICD-10-CM | POA: Insufficient documentation

## 2015-07-30 DIAGNOSIS — R0789 Other chest pain: Secondary | ICD-10-CM | POA: Diagnosis not present

## 2015-07-30 DIAGNOSIS — E119 Type 2 diabetes mellitus without complications: Secondary | ICD-10-CM | POA: Diagnosis not present

## 2015-07-30 DIAGNOSIS — M546 Pain in thoracic spine: Secondary | ICD-10-CM | POA: Insufficient documentation

## 2015-07-30 DIAGNOSIS — I10 Essential (primary) hypertension: Secondary | ICD-10-CM | POA: Diagnosis not present

## 2015-07-30 DIAGNOSIS — R0602 Shortness of breath: Secondary | ICD-10-CM | POA: Insufficient documentation

## 2015-07-30 LAB — CBC
HCT: 42.4 % (ref 36.0–46.0)
HEMOGLOBIN: 14.1 g/dL (ref 12.0–15.0)
MCH: 28.4 pg (ref 26.0–34.0)
MCHC: 33.3 g/dL (ref 30.0–36.0)
MCV: 85.5 fL (ref 78.0–100.0)
PLATELETS: 196 10*3/uL (ref 150–400)
RBC: 4.96 MIL/uL (ref 3.87–5.11)
RDW: 12.8 % (ref 11.5–15.5)
WBC: 4.7 10*3/uL (ref 4.0–10.5)

## 2015-07-30 LAB — BASIC METABOLIC PANEL
Anion gap: 13 (ref 5–15)
BUN: 10 mg/dL (ref 6–20)
CALCIUM: 9.5 mg/dL (ref 8.9–10.3)
CHLORIDE: 105 mmol/L (ref 101–111)
CO2: 25 mmol/L (ref 22–32)
CREATININE: 0.68 mg/dL (ref 0.44–1.00)
GFR calc Af Amer: >60 mL/min (ref >60)
GFR calc non Af Amer: >60 mL/min (ref >60)
Glucose, Bld: 104 mg/dL — ABNORMAL HIGH (ref 65–99)
POTASSIUM: 4.1 mmol/L (ref 3.5–5.1)
Sodium: 143 mmol/L (ref 135–145)

## 2015-07-30 LAB — I-STAT TROPONIN, ED
Troponin i, poc: 0 ng/mL (ref 0.00–0.08)
Troponin i, poc: 0 ng/mL (ref 0.00–0.08)

## 2015-07-30 MED ORDER — LORAZEPAM 1 MG PO TABS
1.0000 mg | ORAL_TABLET | Freq: Once | ORAL | Status: AC
  Administered 2015-07-30: 1 mg via ORAL
  Filled 2015-07-30: qty 1

## 2015-07-30 MED ORDER — LORAZEPAM 2 MG/ML IJ SOLN
1.0000 mg | Freq: Once | INTRAMUSCULAR | Status: DC
  Filled 2015-07-30: qty 1

## 2015-07-30 NOTE — ED Notes (Signed)
Patient transported to X-ray 

## 2015-07-30 NOTE — ED Provider Notes (Signed)
CSN: [id number]     Arrival date & time 07/30/15  1040 History   First MD Initiated Contact with Patient 07/30/15 1050     Chief Complaint  Patient presents with  . Chest Pain  . Shortness of Breath   (Consider location/radiation/quality/duration/timing/severity/associated sxs/prior Treatment) HPI  65 y.o. female presents to the Emergency Department today complaining of chest pain, back pain, and shortness of breath for the past few days. Notes that the pain is on the left upper side of her back. No spinal tenderness. Pain is intermittent with bouts of 10/10 pain. Described as a tightness. Has left arm radiation with numbness. No fevers. No headaches. No abd pain. No N/V/D. No cough. Notes that her husband has been recently placed in rehab for stroke symptoms. She has been primary caregiver for a long time.     Past Medical History  Diagnosis Date  . Diabetes ([place])   . Hypertension   . Depression   . Diverticulitis   . Hyperlipidemia    Past Surgical History  Procedure Laterality Date  . Appendectomy      teenager  . Tonsillectomy     Family History  Problem Relation Age of Onset  . COPD Mother   . Heart disease Mother    Social History  Substance Use Topics  . Smoking status: Former Research scientist (life sciences)  . Smokeless tobacco: None  . Alcohol Use: Yes     Comment: rare- usually not when on medicaiton    OB History    No data available     Review of Systems ROS reviewed and all are negative for acute change except as noted in the HPI.  Allergies  Sulfa antibiotics  Home Medications   Prior to Admission medications   Medication Sig Start Date End Date Taking? Authorizing Provider  acetaminophen (TYLENOL) 500 MG tablet Take 1,000 mg by mouth as needed.    Historical Provider, MD  citalopram (CELEXA) 20 MG tablet TAKE 1 AND 1/2 TABLETS BY MOUTH DAILY 11/26/14   Lucretia Kern, DO  HYDROcodone-acetaminophen (NORCO/VICODIN) 5-325 MG per tablet Every 12 hours as needed for severe pain.  Use as little as possible. 08/10/14   Lucretia Kern, DO  pravastatin (PRAVACHOL) 20 MG tablet Take 1 tablet (20 mg total) by mouth daily. 06/21/14   Lucretia Kern, DO   BP 114/62 mmHg  Pulse 87  Temp(Src) 98.5 F (36.9 C) (Oral)  Resp 22  Ht 5' (1.524 m)  Wt 90.719 kg  BMI 39.06 kg/m2  SpO2 98%   Physical Exam  Constitutional: She is oriented to person, place, and time. She appears well-developed and well-nourished.  HENT:  Head: Normocephalic and atraumatic.  Eyes: EOM are normal. Pupils are equal, round, and reactive to light.  Neck: Trachea normal and normal range of motion. Neck supple.  Cardiovascular: Normal rate, regular rhythm and normal heart sounds.   Pulmonary/Chest: Effort normal and breath sounds normal. No respiratory distress. She has no decreased breath sounds. She has no wheezes. She has no rhonchi. She has no rales.  Abdominal: Soft. Normal appearance and bowel sounds are normal. There is no tenderness.  Musculoskeletal: Normal range of motion.       Cervical back: Normal.       Thoracic back: Normal.  TTP left upper back. No spinous process tenderness.   Neurological: She is alert and oriented to person, place, and time.  Skin: Skin is warm and dry.  Psychiatric: She has a normal mood and affect.  Her behavior is normal. Thought content normal.  Nursing note and vitals reviewed.  ED Course  Procedures (including critical care time) Labs Review Labs Reviewed  BASIC METABOLIC PANEL - Abnormal; Notable for the following:    Glucose, Bld 104 (*)    All other components within normal limits  CBC  I-STAT TROPOININ, ED  Randolm Idol, ED    Imaging Review Dg Chest 2 View  07/30/2015  CLINICAL DATA:  Central chest pain and shortness of breath. EXAM: CHEST - 2 VIEW COMPARISON:  04/12/2014 FINDINGS: The heart size and mediastinal contours are within normal limits. There is no evidence of pulmonary edema, consolidation, pneumothorax, nodule or pleural fluid. The  visualized skeletal structures are unremarkable. IMPRESSION: No active disease. Electronically Signed   By: Aletta Edouard M.D.   On: 07/30/2015 11:54   I have personally reviewed and evaluated these images and lab results as part of my medical decision-making.   EKG Interpretation   Date/Time:  Saturday July 30 2015 10:49:44 EST Ventricular Rate:  95 PR Interval:  116 QRS Duration: 82 QT Interval:  366 QTC Calculation: 459 R Axis:   65 Text Interpretation:  Normal sinus rhythm Nonspecific ST abnormality  Abnormal ECG Confirmed by Hazle Coca [phone]) on 07/30/2015 10:52:08 AM      MDM  I have reviewed relevant laboratory values.I have reviewed relevant imaging studies.I personally interpreted the relevant EKG.I have reviewed the relevant previous healthcare records.I obtained HPI from historian. Patient discussed with supervising physician  ED Course:  Assessment: 40y F presents with chest/back pain for the past few days. Described as tightness in left upper back. No spinous process tenderness. On exam, patient anxious. Lungs CTA. Heart RRR. Afebrile. VSS. Afebrile. Perc negative. No tracheal deviation, no JVD or new murmur. Given 1mg  Ativan due to anxiety. Initial trop negative. EKG unremarkable. CXR unremarkable. On reexamination, patient symptoms improved with Ativan. Second trop neg. Will DC with follow up to PCP for further management of symptoms. Return to the ED is CP becomes exertional, associated with diaphoresis or nausea, radiates to left jaw/arm, worsens or becomes concerning in any way. Pt appears reliable for follow up and is agreeable to discharge.   Disposition/Plan:  DC Home Additional Verbal discharge instructions given and discussed with patient.  Pt Instructed to f/u with PCP in the next 48-72 hours for evaluation and treatment of symptoms. Return precautions given Pt acknowledges and agrees with plan  Supervising Physician Quintella Reichert, MD   Final  diagnoses:  Chest pain, unspecified chest pain type       Shary Decamp, PA-C 07/30/15 1555  Quintella Reichert, MD 07/31/15 [phone]

## 2015-07-30 NOTE — Discharge Instructions (Signed)
Please read and follow all provided instructions.  Your diagnoses today include:  1. Chest pain, unspecified chest pain type    Tests performed today include:  An EKG of your heart  A chest x-ray  Cardiac enzymes - a blood test for heart muscle damage  Blood counts and electrolytes  Vital signs. See below for your results today.   Medications prescribed:   Take any prescribed medications only as directed.  Follow-up instructions: Please follow-up with your primary care provider as soon as you can for further evaluation of your symptoms.   Return instructions:  SEEK IMMEDIATE MEDICAL ATTENTION IF:  You have severe chest pain, especially if the pain is crushing or pressure-like and spreads to the arms, back, neck, or jaw, or if you have sweating, nausea (feeling sick to your stomach), or shortness of breath. THIS IS AN EMERGENCY. Don't wait to see if the pain will go away. Get medical help at once. Call 911 or 0 (operator). DO NOT drive yourself to the hospital.   Your chest pain gets worse and does not go away with rest.   You have an attack of chest pain lasting longer than usual, despite rest and treatment with the medications your caregiver has prescribed.   You wake from sleep with chest pain or shortness of breath.  You feel dizzy or faint.  You have chest pain not typical of your usual pain for which you originally saw your caregiver.   You have any other emergent concerns regarding your health.  Additional Information: Chest pain comes from many different causes. Your caregiver has diagnosed you as having chest pain that is not specific for one problem, but does not require admission.  You are at low risk for an acute heart condition or other serious illness.   Your vital signs today were: BP 140/80 mmHg   Pulse 80   Temp(Src) 98.5 F (36.9 C) (Oral)   Resp 18   Ht 5' (1.524 m)   Wt 90.719 kg   BMI 39.06 kg/m2   SpO2 97% If your blood pressure (BP) was elevated  above 135/85 this visit, please have this repeated by your doctor within one month. --------------

## 2015-07-30 NOTE — ED Notes (Signed)
Pt c/o center chest pain and shortness of breath ongoing for couple days. Pt denies any other symptoms.

## 2015-08-17 DIAGNOSIS — F418 Other specified anxiety disorders: Secondary | ICD-10-CM | POA: Diagnosis not present

## 2015-08-17 DIAGNOSIS — F410 Panic disorder [episodic paroxysmal anxiety] without agoraphobia: Secondary | ICD-10-CM | POA: Diagnosis not present

## 2015-09-20 DIAGNOSIS — F418 Other specified anxiety disorders: Secondary | ICD-10-CM | POA: Diagnosis not present

## 2015-09-20 DIAGNOSIS — F410 Panic disorder [episodic paroxysmal anxiety] without agoraphobia: Secondary | ICD-10-CM | POA: Diagnosis not present

## 2015-12-06 ENCOUNTER — Other Ambulatory Visit: Payer: Self-pay

## 2015-12-06 ENCOUNTER — Emergency Department (HOSPITAL_COMMUNITY)
Admission: EM | Admit: 2015-12-06 | Discharge: 2015-12-06 | Disposition: A | Discharge status: Home / Self Care | Payer: Medicare Other | Attending: Emergency Medicine | Admitting: Emergency Medicine

## 2015-12-06 ENCOUNTER — Emergency Department (HOSPITAL_COMMUNITY): Payer: Medicare Other

## 2015-12-06 ENCOUNTER — Encounter (HOSPITAL_COMMUNITY): Payer: Self-pay | Admitting: Emergency Medicine

## 2015-12-06 DIAGNOSIS — R55 Syncope and collapse: Secondary | ICD-10-CM | POA: Diagnosis not present

## 2015-12-06 DIAGNOSIS — I10 Essential (primary) hypertension: Secondary | ICD-10-CM | POA: Insufficient documentation

## 2015-12-06 DIAGNOSIS — E119 Type 2 diabetes mellitus without complications: Secondary | ICD-10-CM | POA: Diagnosis not present

## 2015-12-06 DIAGNOSIS — R42 Dizziness and giddiness: Secondary | ICD-10-CM | POA: Diagnosis not present

## 2015-12-06 DIAGNOSIS — Z87891 Personal history of nicotine dependence: Secondary | ICD-10-CM | POA: Insufficient documentation

## 2015-12-06 DIAGNOSIS — K5732 Diverticulitis of large intestine without perforation or abscess without bleeding: Secondary | ICD-10-CM

## 2015-12-06 DIAGNOSIS — R1031 Right lower quadrant pain: Secondary | ICD-10-CM | POA: Diagnosis not present

## 2015-12-06 DIAGNOSIS — R404 Transient alteration of awareness: Secondary | ICD-10-CM | POA: Diagnosis not present

## 2015-12-06 LAB — COMPREHENSIVE METABOLIC PANEL
ALBUMIN: 3.8 g/dL (ref 3.5–5.0)
ALK PHOS: 72 U/L (ref 38–126)
ALT: 14 U/L (ref 14–54)
ANION GAP: 6 (ref 5–15)
AST: 18 U/L (ref 15–41)
BUN: 8 mg/dL (ref 6–20)
CHLORIDE: 101 mmol/L (ref 101–111)
CO2: 30 mmol/L (ref 22–32)
Calcium: 9.4 mg/dL (ref 8.9–10.3)
Creatinine, Ser: 0.6 mg/dL (ref 0.44–1.00)
GFR calc Af Amer: >60 mL/min (ref >60)
GFR calc non Af Amer: >60 mL/min (ref >60)
GLUCOSE: 127 mg/dL — AB (ref 65–99)
POTASSIUM: 4 mmol/L (ref 3.5–5.1)
SODIUM: 137 mmol/L (ref 135–145)
Total Bilirubin: 1 mg/dL (ref 0.3–1.2)
Total Protein: 7.1 g/dL (ref 6.5–8.1)

## 2015-12-06 LAB — URINALYSIS, ROUTINE W REFLEX MICROSCOPIC
Bilirubin Urine: NEGATIVE
Glucose, UA: NEGATIVE mg/dL
KETONES UR: NEGATIVE mg/dL
LEUKOCYTES UA: NEGATIVE
Nitrite: NEGATIVE
PROTEIN: NEGATIVE mg/dL
Specific Gravity, Urine: 1.007 (ref 1.005–1.030)
pH: 7 (ref 5.0–8.0)

## 2015-12-06 LAB — CBC WITH DIFFERENTIAL/PLATELET
BASOS PCT: 0 %
Basophils Absolute: 0 10*3/uL (ref 0.0–0.1)
EOS ABS: 0.1 10*3/uL (ref 0.0–0.7)
EOS PCT: 0 %
HCT: 39.8 % (ref 36.0–46.0)
HEMOGLOBIN: 12.9 g/dL (ref 12.0–15.0)
Lymphocytes Relative: 8 %
Lymphs Abs: 1 10*3/uL (ref 0.7–4.0)
MCH: 28.1 pg (ref 26.0–34.0)
MCHC: 32.4 g/dL (ref 30.0–36.0)
MCV: 86.7 fL (ref 78.0–100.0)
MONOS PCT: 7 %
Monocytes Absolute: 0.9 10*3/uL (ref 0.1–1.0)
NEUTROS PCT: 85 %
Neutro Abs: 10.6 10*3/uL — ABNORMAL HIGH (ref 1.7–7.7)
PLATELETS: 162 10*3/uL (ref 150–400)
RBC: 4.59 MIL/uL (ref 3.87–5.11)
RDW: 13.5 % (ref 11.5–15.5)
WBC: 12.6 10*3/uL — AB (ref 4.0–10.5)

## 2015-12-06 LAB — I-STAT TROPONIN, ED: Troponin i, poc: 0 ng/mL (ref 0.00–0.08)

## 2015-12-06 LAB — LIPASE, BLOOD: Lipase: 23 U/L (ref 11–51)

## 2015-12-06 LAB — URINE MICROSCOPIC-ADD ON

## 2015-12-06 LAB — I-STAT CG4 LACTIC ACID, ED: LACTIC ACID, VENOUS: 1.57 mmol/L (ref 0.5–2.0)

## 2015-12-06 MED ORDER — OXYCODONE-ACETAMINOPHEN 5-325 MG PO TABS: 0.5000 | ORAL_TABLET | Freq: Four times a day (QID) | ORAL | Status: AC | PRN

## 2015-12-06 MED ORDER — IOPAMIDOL (ISOVUE-300) INJECTION 61%
INTRAVENOUS | Status: AC
Start: 2015-12-06 — End: 2015-12-06
  Administered 2015-12-06: 100 mL
  Filled 2015-12-06: qty 100

## 2015-12-06 MED ORDER — METRONIDAZOLE IN NACL 5-0.79 MG/ML-% IV SOLN
500.0000 mg | Freq: Once | INTRAVENOUS | Status: AC
  Administered 2015-12-06: 500 mg via INTRAVENOUS
  Filled 2015-12-06: qty 100

## 2015-12-06 MED ORDER — SODIUM CHLORIDE 0.9 % IV BOLUS (SEPSIS)
1000.0000 mL | Freq: Once | INTRAVENOUS | Status: AC
  Administered 2015-12-06: 1000 mL via INTRAVENOUS

## 2015-12-06 MED ORDER — MORPHINE SULFATE (PF) 4 MG/ML IV SOLN
4.0000 mg | Freq: Once | INTRAVENOUS | Status: AC
  Administered 2015-12-06: 4 mg via INTRAVENOUS
  Filled 2015-12-06: qty 1

## 2015-12-06 MED ORDER — CIPROFLOXACIN HCL 500 MG PO TABS: 500.0000 mg | ORAL_TABLET | Freq: Two times a day (BID) | ORAL | Status: AC

## 2015-12-06 MED ORDER — METRONIDAZOLE 500 MG PO TABS: 500.0000 mg | ORAL_TABLET | Freq: Three times a day (TID) | ORAL | Status: AC

## 2015-12-06 MED ORDER — CIPROFLOXACIN IN D5W 400 MG/200ML IV SOLN
400.0000 mg | Freq: Once | INTRAVENOUS | Status: AC
  Administered 2015-12-06: 400 mg via INTRAVENOUS
  Filled 2015-12-06: qty 200

## 2015-12-06 MED ORDER — ACETAMINOPHEN 325 MG PO TABS
650.0000 mg | ORAL_TABLET | Freq: Once | ORAL | Status: AC
  Administered 2015-12-06: 650 mg via ORAL
  Filled 2015-12-06: qty 2

## 2015-12-06 NOTE — ED Notes (Signed)
Reviewed discharge instructions with patient and family. No questions from patient. Patient wheeled out of ED with daughter. Patient states no pain

## 2015-12-06 NOTE — ED Provider Notes (Signed)
3:29 PM Handoff from [facility] and Dr. Gilford Raid at shift change. Pending completion of IV flagyl and cipro, hydration. Patient with LOC this AM and abdominal pain. Treated for diverticulitis here in ED. She had mild orthostasis and this was treated with hydration.   EKG Interpretation  Date/Time:  Tuesday December 06 2015 09:28:17 EDT Ventricular Rate:  70 PR Interval:    QRS Duration: 95 QT Interval:  437 QTC Calculation: 472 R Axis:   71 Text Interpretation:  Sinus rhythm Borderline short PR interval Confirmed by HAVILAND MD, JULIE ([id number]) on 12/06/2015 11:12:04 AM  Plan: D/c when fluids and IV abx or completed.   5:53 PM Treatment completed, d/c to home.   BP 136/68 mmHg  Pulse 75  Temp(Src) 98 F (36.7 C) (Oral)  Resp 19  SpO2 100%  6:01 PM patient seen, questions answered. Her pain is returning. She did well with the morphine given earlier, will give additional dose prior to discharge. Her daughter will be driving her home. Patient prescribed a small amount of Percocet for home for pain control. Encouraged her to follow-up with her PCP in the next 3 days for recheck. Return to the emergency department with worsening abdominal pain, persistent vomiting, blood in the stool, fever, or if she has additional episodes of syncope. Patient and daughter verbalized understanding and agree with plan.  Carlisle Cater, PA-C 12/06/15 1802  Pattricia Boss, MD 12/07/15 1326

## 2015-12-06 NOTE — Discharge Instructions (Signed)
Follow-up your primary care provider within 2 days to be reevaluated for your diverticulitis. Take the antibiotics as prescribed and be sure to complete the full course. Follow a liquid diet for 2 days and then advance to bland soft foods for the next 3 days.   Return to the emergency department if you experience worsening abdominal pain, blood in your stool, fever, chills, chest pain or shortness of breath.  Diverticulitis Diverticulitis is inflammation or infection of small pouches in your colon that form when you have a condition called diverticulosis. The pouches in your colon are called diverticula. Your colon, or large intestine, is where water is absorbed and stool is formed. Complications of diverticulitis can include:  Bleeding.  Severe infection.  Severe pain.  Perforation of your colon.  Obstruction of your colon. CAUSES  Diverticulitis is caused by bacteria. Diverticulitis happens when stool becomes trapped in diverticula. This allows bacteria to grow in the diverticula, which can lead to inflammation and infection. RISK FACTORS People with diverticulosis are at risk for diverticulitis. Eating a diet that does not include enough fiber from fruits and vegetables may make diverticulitis more likely to develop. SYMPTOMS  Symptoms of diverticulitis may include:  Abdominal pain and tenderness. The pain is normally located on the left side of the abdomen, but may occur in other areas.  Fever and chills.  Bloating.  Cramping.  Nausea.  Vomiting.  Constipation.  Diarrhea.  Blood in your stool. DIAGNOSIS  Your health care provider will ask you about your medical history and do a physical exam. You may need to have tests done because many medical conditions can cause the same symptoms as diverticulitis. Tests may include:  Blood tests.  Urine tests.  Imaging tests of the abdomen, including X-rays and CT scans. When your condition is under control, your health care  provider may recommend that you have a colonoscopy. A colonoscopy can show how severe your diverticula are and whether something else is causing your symptoms. TREATMENT  Most cases of diverticulitis are mild and can be treated at home. Treatment may include:  Taking over-the-counter pain medicines.  Following a clear liquid diet.  Taking antibiotic medicines by mouth for 7-10 days. More severe cases may be treated at a hospital. Treatment may include:  Not eating or drinking.  Taking prescription pain medicine.  Receiving antibiotic medicines through an IV tube.  Receiving fluids and nutrition through an IV tube.  Surgery. HOME CARE INSTRUCTIONS   Follow your health care provider's instructions carefully.  Follow a full liquid diet or other diet as directed by your health care provider. After your symptoms improve, your health care provider may tell you to change your diet. He or she may recommend you eat a high-fiber diet. Fruits and vegetables are good sources of fiber. Fiber makes it easier to pass stool.  Take fiber supplements or probiotics as directed by your health care provider.  Only take medicines as directed by your health care provider.  Keep all your follow-up appointments. SEEK MEDICAL CARE IF:   Your pain does not improve.  You have a hard time eating food.  Your bowel movements do not return to normal. SEEK IMMEDIATE MEDICAL CARE IF:   Your pain becomes worse.  Your symptoms do not get better.  Your symptoms suddenly get worse.  You have a fever.  You have repeated vomiting.  You have bloody or black, tarry stools. MAKE SURE YOU:   Understand these instructions.  Will watch your condition.  Will get help right away if you are not doing well or get worse.   This information is not intended to replace advice given to you by your health care provider. Make sure you discuss any questions you have with your health care provider.   Document  Released: 03/14/2005 Document Revised: 06/09/2013 Document Reviewed: 04/29/2013 Elsevier Interactive Patient Education [employer].

## 2015-12-06 NOTE — ED Notes (Signed)
Pt in x-ray at this time

## 2015-12-06 NOTE — ED Notes (Signed)
Assisted patient to bathroom, repositioned in bed

## 2015-12-06 NOTE — ED Notes (Addendum)
Patient reports diffuse belly pain tender upon palpation; last BM day before yesterday; reports never really drinks water; RLQ more tender and radiates to back. 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

## 2015-12-06 NOTE — ED Notes (Signed)
Pt used bedside commode with minimal assistance 

## 2015-12-06 NOTE — ED Notes (Signed)
Pt was leaning down feeding cat and felt dizzy; LOC; slumped down. Unwitnessed. Complaining of RLQ pain. Hx of diverticulitis 5 years ago and feels better when splinted. Was nasueated but never vomited. BP initially [id number] systolic. Per EMS pale but not diaphoretic; CBG 133.

## 2015-12-06 NOTE — ED Provider Notes (Signed)
CSN: [id number]     Arrival date & time 12/06/15  0848 History   First MD Initiated Contact with Patient 12/06/15 [id number]     Chief Complaint  Patient presents with  . Loss of Consciousness     (Consider location/radiation/quality/duration/timing/severity/associated sxs/prior Treatment) HPI   Patient is 65 year old female with a history of diabetes, HTN, diverticulitis presents the ED after a syncopal event today that occurred roughly around 8:30 this morning. Patient states she was standing at her counter and felt the sensation of passing out and woke up on the floor. She said she was fatigued, she tried getting to a chair but was unable to and she passed out again. Patient says she had right upper quadrant abdominal pain yesterday that started around 3 PM that radiated to her back. Pain was constant, sharp, 10/10 with associated nausea, dizziness and increased urinary frequency. Currently her abdominal pain is dull in nature. She denies headache, visual changes, chest pain, shortness of breath, diarrhea, dysuria, hematuria, hematochezia, numbness/tingling, extremity weakness.  Past Medical History  Diagnosis Date  . Diabetes ([place])   . Hypertension   . Depression   . Diverticulitis   . Hyperlipidemia    Past Surgical History  Procedure Laterality Date  . Appendectomy      teenager  . Tonsillectomy     Family History  Problem Relation Age of Onset  . COPD Mother   . Heart disease Mother    Social History  Substance Use Topics  . Smoking status: Former Research scientist (life sciences)  . Smokeless tobacco: None  . Alcohol Use: Yes     Comment: rare- usually not when on medicaiton    OB History    No data available     Review of Systems  Constitutional: Positive for fatigue. Negative for fever, chills and diaphoresis.  Eyes: Negative for visual disturbance.  Respiratory: Negative for chest tightness and shortness of breath.   Cardiovascular: Negative for chest pain.  Gastrointestinal: Positive for  nausea and abdominal pain. Negative for vomiting, diarrhea, blood in stool and abdominal distention.  Genitourinary: Positive for frequency. Negative for dysuria, hematuria and flank pain.  Musculoskeletal: Negative for back pain and neck pain.  Skin: Negative for rash.  Neurological: Positive for syncope. Negative for dizziness, weakness, numbness and headaches.  Psychiatric/Behavioral: Negative for confusion.      Allergies  Sulfa antibiotics  Home Medications   Prior to Admission medications   Medication Sig Start Date End Date Taking? Authorizing Provider  acetaminophen (TYLENOL) 500 MG tablet Take 1,000 mg by mouth every 8 (eight) hours as needed (pain).    Yes Historical Provider, MD  ALPRAZOLAM PO Take 1 tablet by mouth 2 (two) times daily as needed (anxiety).   Yes Historical Provider, MD  citalopram (CELEXA) 20 MG tablet TAKE 1 AND 1/2 TABLETS BY MOUTH DAILY Patient taking differently: TAKE 1 TABLET BY MOUTH DAILY 11/26/14  Yes Lucretia Kern, DO  ciprofloxacin (CIPRO) 500 MG tablet Take 1 tablet (500 mg total) by mouth 2 (two) times daily. 12/06/15   Kalman Drape, PA  metroNIDAZOLE (FLAGYL) 500 MG tablet Take 1 tablet (500 mg total) by mouth 3 (three) times daily. 12/06/15   Jessica L Focht, PA   BP 137/63 mmHg  Pulse 74  Temp(Src) 98 F (36.7 C) (Oral)  Resp 25  SpO2 98% Physical Exam  Constitutional: She appears well-developed and well-nourished. No distress.  HENT:  Head: Normocephalic and atraumatic.  Eyes: Conjunctivae are normal.  Neck: Normal range of  motion.  Cardiovascular: Normal rate, regular rhythm and normal heart sounds.  Exam reveals no gallop and no friction rub.   No murmur heard. Pulmonary/Chest: Effort normal and breath sounds normal. No respiratory distress. She has no wheezes. She has no rales.  Abdominal: Soft. Bowel sounds are normal. She exhibits no distension. There is generalized tenderness. There is no rigidity, no rebound, no guarding, no CVA  tenderness and negative Murphy's sign.  Musculoskeletal: Normal range of motion.  Neurological: She is alert. Coordination normal.  Skin: Skin is warm and dry. No rash noted. She is not diaphoretic.  Psychiatric: She has a normal mood and affect. Her behavior is normal.  Nursing note and vitals reviewed.   ED Course  Procedures (including critical care time) Labs Review Labs Reviewed  CBC WITH DIFFERENTIAL/PLATELET - Abnormal; Notable for the following:    WBC 12.6 (*)    Neutro Abs 10.6 (*)    All other components within normal limits  COMPREHENSIVE METABOLIC PANEL - Abnormal; Notable for the following:    Glucose, Bld 127 (*)    All other components within normal limits  URINALYSIS, ROUTINE W REFLEX MICROSCOPIC (NOT AT [facility]) - Abnormal; Notable for the following:    Hgb urine dipstick MODERATE (*)    All other components within normal limits  URINE MICROSCOPIC-ADD ON - Abnormal; Notable for the following:    Squamous Epithelial / LPF 0-5 (*)    Bacteria, UA RARE (*)    Casts HYALINE CASTS (*)    All other components within normal limits  LIPASE, BLOOD  I-STAT TROPOININ, ED  I-STAT CG4 LACTIC ACID, ED    Imaging Review Dg Chest 2 View  12/06/2015  CLINICAL DATA:  Syncopal episode this morning, narrowing Lee avoided falling, patient reports some chest and back pain, history of hypertension with [place] spaces, diabetes. EXAM: CHEST  2 VIEW COMPARISON:  PA and lateral chest x-ray of August 05, 2015 FINDINGS: The lungs are adequately inflated. There is prominence of the interstitial markings which is not new. There is no alveolar pneumonia. The heart and pulmonary vascularity are within the limits of normal. The mediastinum is normal in width. There is no pleural effusion or pneumothorax. There is mild degenerative disc disease at multiple thoracic levels. IMPRESSION: Mild chronic bronchitic changes, stable. There is no CHF, pneumonia, nor other acute cardiopulmonary abnormality.  Electronically Signed   By: David  Martinique M.D.   On: 12/06/2015 09:26   Ct Abdomen Pelvis W Contrast  12/06/2015  CLINICAL DATA:  Right lower quadrant abdominal pain, back pain starting today, nausea, history of diverticulitis EXAM: CT ABDOMEN AND PELVIS WITH CONTRAST TECHNIQUE: Multidetector CT imaging of the abdomen and pelvis was performed using the standard protocol following bolus administration of intravenous contrast. CONTRAST:  144mL ISOVUE-300 IOPAMIDOL (ISOVUE-300) INJECTION 61% COMPARISON:  None. FINDINGS: Lower chest: The lung bases are unremarkable. A tiny calcified granuloma noted right base anteriorly measures 2 mm. Hepatobiliary: There is mild fatty infiltration of the liver. No focal hepatic mass. No calcified gallstones are noted within gallbladder. Pancreas: Enhanced pancreas shows no focal mass or surrounding inflammation. Spleen: Enhanced spleen is unremarkable. Adrenals/Urinary Tract: No adrenal gland mass is noted. Enhanced kidneys are symmetrical size. No hydronephrosis or hydroureter. Delayed renal images shows bilateral renal symmetrical excretion. Bilateral visualized proximal ureter is unremarkable. The urinary bladder is unremarkable. Stomach/Bowel: There is no small bowel obstruction. No gastric outlet obstruction. No thickened or dilated small bowel loops are noted. There is no pericecal inflammation. The  terminal ileum is unremarkable. The patient is status post appendectomy. Some colonic gas noted in transverse colon. Some colonic gas noted in descending colon. Sigmoid colon diverticula are noted. In axial image 66 there is mild focal thickening is of sigmoid colon wall in mid sigmoid colon. There is a colonic diverticulum with thickening of the wall and wall enhancement. There is stranding of surrounding fat. This is best seen in axial image 66 and coronal image 80. Findings are consistent with focal acute diverticulitis or focal colitis. No definite evidence of perforation. No  evidence of pericolonic abscess. Moderate colonic gas noted within rectum. No colonic obstruction. Vascular/Lymphatic: Atherosclerotic calcifications of abdominal aorta and iliac arteries. No aortic aneurysm. There is no retroperitoneal or mesenteric adenopathy. Reproductive: The uterus is anteflexed the uterus and ovaries are unremarkable. No adnexal mass. Other: There is no abdominal ascites. No evidence of free abdominal air. There is a tiny umbilical region hernia containing fat measures 1.4 cm. No evidence of acute complication. Please see sagittal image 87. Musculoskeletal: Sagittal images of the spine shows diffuse mild osteopenia. Degenerative changes are noted lower thoracic spine. Mild degenerative changes are noted lumbar spine. Degenerative changes bilateral SI joints. Degenerative changes pubic symphysis. IMPRESSION: 1. In axial image 66 there is mild focal thickening is of colonic wall in mid sigmoid colon. There is a colonic diverticulum with thickening of the wall and wall enhancement. There is stranding of surrounding fat. This is best seen in axial image 66 and coronal image 80. Findings are consistent with focal acute diverticulitis or focal colitis. No definite evidence of perforation. No evidence of pericolonic abscess. 2. No pericecal inflammation. The patient is status post appendectomy. 3. Mild fatty infiltration of the liver. 4. No hydronephrosis or hydroureter. 5. No small bowel obstruction. Electronically Signed   By: Lahoma Crocker M.D.   On: 12/06/2015 12:21   I have personally reviewed and evaluated these images and lab results as part of my medical decision-making.   EKG Interpretation   Date/Time:  Tuesday December 06 2015 09:28:17 EDT Ventricular Rate:  70 PR Interval:    QRS Duration: 95 QT Interval:  437 QTC Calculation: 472 R Axis:   71 Text Interpretation:  Sinus rhythm Borderline short PR interval Confirmed  by HAVILAND MD, JULIE ([id number]) on 12/06/2015 11:12:04 AM       MDM   Final diagnoses:  Diverticulitis of large intestine without perforation or abscess without bleeding  Syncope, unspecified syncope type   Patient with syncopal episode and abdominal pain. Patient with history diverticulitis. Patient EKG sinus rhythm with borderline short PR interval. Patient without chest pain or shortness of breath and chest x-ray without acute abnormalities less likely her syncopal episode is cardiopulmonary in etiology.   CT scan to evaluate abdominal pain revealed acute diverticulitis. On re-examination patient did not have a surgical abdomen. Patient was given fluids and pain medication while in the ED. Gave patient IV Cipro and Flagyl. Patient was able to ambulate without dizziness while in the ED.   UA without signs of UTI but with blood. Will culture her urine.   Patient will be discharged PO Cipro and Flagyl. Discussed liquid and bland diet with patient.  Instructed patient to follow-up with her primary care provider within 2 days.  Patient care signed out at end of shift to [facility], PA-C. Pending completion of IV Flagyl.    Kalman Drape, PA 12/06/15 1703  Isla Pence, MD 12/10/15 [phone]

## 2015-12-07 LAB — URINE CULTURE

## 2015-12-08 DIAGNOSIS — K5792 Diverticulitis of intestine, part unspecified, without perforation or abscess without bleeding: Secondary | ICD-10-CM | POA: Diagnosis not present

## 2015-12-08 DIAGNOSIS — F418 Other specified anxiety disorders: Secondary | ICD-10-CM | POA: Diagnosis not present

## 2016-03-21 DIAGNOSIS — Z136 Encounter for screening for cardiovascular disorders: Secondary | ICD-10-CM | POA: Diagnosis not present

## 2016-03-21 DIAGNOSIS — Z131 Encounter for screening for diabetes mellitus: Secondary | ICD-10-CM | POA: Diagnosis not present

## 2016-03-21 DIAGNOSIS — Z23 Encounter for immunization: Secondary | ICD-10-CM | POA: Diagnosis not present

## 2016-03-21 DIAGNOSIS — F410 Panic disorder [episodic paroxysmal anxiety] without agoraphobia: Secondary | ICD-10-CM | POA: Diagnosis not present

## 2016-03-21 DIAGNOSIS — F418 Other specified anxiety disorders: Secondary | ICD-10-CM | POA: Diagnosis not present

## 2016-04-21 DIAGNOSIS — M79672 Pain in left foot: Secondary | ICD-10-CM | POA: Diagnosis not present

## 2016-04-21 DIAGNOSIS — M25572 Pain in left ankle and joints of left foot: Secondary | ICD-10-CM | POA: Diagnosis not present

## 2016-07-27 ENCOUNTER — Telehealth: Payer: Self-pay | Admitting: Family Medicine

## 2016-09-24 DIAGNOSIS — M79642 Pain in left hand: Secondary | ICD-10-CM | POA: Diagnosis not present

## 2016-09-24 DIAGNOSIS — F418 Other specified anxiety disorders: Secondary | ICD-10-CM | POA: Diagnosis not present

## 2016-09-24 DIAGNOSIS — Z1211 Encounter for screening for malignant neoplasm of colon: Secondary | ICD-10-CM | POA: Diagnosis not present

## 2016-10-01 DIAGNOSIS — Z1211 Encounter for screening for malignant neoplasm of colon: Secondary | ICD-10-CM | POA: Diagnosis not present

## 2016-10-24 DIAGNOSIS — Z0000 Encounter for general adult medical examination without abnormal findings: Secondary | ICD-10-CM | POA: Diagnosis not present

## 2016-10-24 DIAGNOSIS — R7309 Other abnormal glucose: Secondary | ICD-10-CM | POA: Diagnosis not present

## 2016-10-24 DIAGNOSIS — F418 Other specified anxiety disorders: Secondary | ICD-10-CM | POA: Diagnosis not present

## 2016-10-30 NOTE — Telephone Encounter (Signed)
Called pt about scheduling her awv - stated that she is no longer a pt here.

## 2017-04-29 DIAGNOSIS — F418 Other specified anxiety disorders: Secondary | ICD-10-CM | POA: Diagnosis not present

## 2017-04-29 DIAGNOSIS — Z6837 Body mass index (BMI) 37.0-37.9, adult: Secondary | ICD-10-CM | POA: Diagnosis not present

## 2017-04-29 DIAGNOSIS — R7309 Other abnormal glucose: Secondary | ICD-10-CM | POA: Diagnosis not present

## 2017-10-29 DIAGNOSIS — Z1211 Encounter for screening for malignant neoplasm of colon: Secondary | ICD-10-CM | POA: Diagnosis not present

## 2017-10-29 DIAGNOSIS — F418 Other specified anxiety disorders: Secondary | ICD-10-CM | POA: Diagnosis not present

## 2017-10-29 DIAGNOSIS — Z0000 Encounter for general adult medical examination without abnormal findings: Secondary | ICD-10-CM | POA: Diagnosis not present

## 2017-10-29 DIAGNOSIS — R7309 Other abnormal glucose: Secondary | ICD-10-CM | POA: Diagnosis not present

## 2017-10-31 ENCOUNTER — Other Ambulatory Visit: Payer: Self-pay | Admitting: Family Medicine

## 2017-10-31 DIAGNOSIS — Z1231 Encounter for screening mammogram for malignant neoplasm of breast: Secondary | ICD-10-CM

## 2017-11-05 DIAGNOSIS — Z23 Encounter for immunization: Secondary | ICD-10-CM | POA: Diagnosis not present

## 2017-11-21 ENCOUNTER — Ambulatory Visit
Admission: RE | Admit: 2017-11-21 | Discharge: 2017-11-21 | Disposition: A | Discharge status: Home / Self Care | Payer: Medicare Other | Source: Ambulatory Visit | Attending: Family Medicine | Admitting: Family Medicine

## 2017-11-21 DIAGNOSIS — Z1231 Encounter for screening mammogram for malignant neoplasm of breast: Secondary | ICD-10-CM | POA: Diagnosis not present

## 2017-12-02 DIAGNOSIS — F418 Other specified anxiety disorders: Secondary | ICD-10-CM | POA: Diagnosis not present

## 2017-12-02 DIAGNOSIS — R7309 Other abnormal glucose: Secondary | ICD-10-CM | POA: Diagnosis not present

## 2017-12-02 DIAGNOSIS — F410 Panic disorder [episodic paroxysmal anxiety] without agoraphobia: Secondary | ICD-10-CM | POA: Diagnosis not present

## 2018-03-05 DIAGNOSIS — F410 Panic disorder [episodic paroxysmal anxiety] without agoraphobia: Secondary | ICD-10-CM | POA: Diagnosis not present

## 2018-03-05 DIAGNOSIS — F418 Other specified anxiety disorders: Secondary | ICD-10-CM | POA: Diagnosis not present

## 2018-03-05 DIAGNOSIS — R7309 Other abnormal glucose: Secondary | ICD-10-CM | POA: Diagnosis not present

## 2018-10-15 ENCOUNTER — Other Ambulatory Visit: Payer: Self-pay | Admitting: Family Medicine

## 2018-10-15 DIAGNOSIS — Z1231 Encounter for screening mammogram for malignant neoplasm of breast: Secondary | ICD-10-CM

## 2018-10-17 DIAGNOSIS — L089 Local infection of the skin and subcutaneous tissue, unspecified: Secondary | ICD-10-CM | POA: Diagnosis not present

## 2018-10-17 DIAGNOSIS — R05 Cough: Secondary | ICD-10-CM | POA: Diagnosis not present

## 2018-10-17 DIAGNOSIS — U071 COVID-19: Secondary | ICD-10-CM | POA: Diagnosis not present

## 2018-10-18 DIAGNOSIS — R05 Cough: Secondary | ICD-10-CM | POA: Diagnosis not present

## 2018-10-18 DIAGNOSIS — U071 COVID-19: Secondary | ICD-10-CM | POA: Diagnosis not present

## 2018-10-18 DIAGNOSIS — R0602 Shortness of breath: Secondary | ICD-10-CM | POA: Diagnosis not present

## 2018-10-18 DIAGNOSIS — R600 Localized edema: Secondary | ICD-10-CM | POA: Diagnosis not present

## 2018-10-20 ENCOUNTER — Other Ambulatory Visit: Payer: Self-pay | Admitting: *Deleted

## 2018-10-20 ENCOUNTER — Ambulatory Visit
Admission: RE | Admit: 2018-10-20 | Discharge: 2018-10-20 | Disposition: A | Discharge status: Home / Self Care | Payer: Medicare Other | Source: Ambulatory Visit | Attending: *Deleted | Admitting: *Deleted

## 2018-10-20 DIAGNOSIS — R05 Cough: Secondary | ICD-10-CM

## 2018-10-20 DIAGNOSIS — U071 COVID-19: Secondary | ICD-10-CM | POA: Diagnosis not present

## 2018-10-20 DIAGNOSIS — R059 Cough, unspecified: Secondary | ICD-10-CM

## 2018-10-22 DIAGNOSIS — R05 Cough: Secondary | ICD-10-CM | POA: Diagnosis not present

## 2018-10-22 DIAGNOSIS — R432 Parageusia: Secondary | ICD-10-CM | POA: Diagnosis not present

## 2018-10-22 DIAGNOSIS — L089 Local infection of the skin and subcutaneous tissue, unspecified: Secondary | ICD-10-CM | POA: Diagnosis not present

## 2018-11-05 DIAGNOSIS — R432 Parageusia: Secondary | ICD-10-CM | POA: Diagnosis not present

## 2018-11-05 DIAGNOSIS — L309 Dermatitis, unspecified: Secondary | ICD-10-CM | POA: Diagnosis not present

## 2018-11-05 DIAGNOSIS — F411 Generalized anxiety disorder: Secondary | ICD-10-CM | POA: Diagnosis not present

## 2018-11-05 DIAGNOSIS — Z0000 Encounter for general adult medical examination without abnormal findings: Secondary | ICD-10-CM | POA: Diagnosis not present

## 2018-11-05 DIAGNOSIS — Z1211 Encounter for screening for malignant neoplasm of colon: Secondary | ICD-10-CM | POA: Diagnosis not present

## 2018-11-05 DIAGNOSIS — R7309 Other abnormal glucose: Secondary | ICD-10-CM | POA: Diagnosis not present

## 2018-11-19 DIAGNOSIS — R7309 Other abnormal glucose: Secondary | ICD-10-CM | POA: Diagnosis not present

## 2018-11-19 DIAGNOSIS — Z1211 Encounter for screening for malignant neoplasm of colon: Secondary | ICD-10-CM | POA: Diagnosis not present

## 2018-12-11 ENCOUNTER — Ambulatory Visit
Admission: RE | Admit: 2018-12-11 | Discharge: 2018-12-11 | Disposition: A | Discharge status: Home / Self Care | Payer: Medicare Other | Source: Ambulatory Visit | Attending: Family Medicine | Admitting: Family Medicine

## 2018-12-11 ENCOUNTER — Other Ambulatory Visit: Payer: Self-pay

## 2018-12-11 DIAGNOSIS — Z1231 Encounter for screening mammogram for malignant neoplasm of breast: Secondary | ICD-10-CM | POA: Diagnosis not present

## 2018-12-29 DIAGNOSIS — R438 Other disturbances of smell and taste: Secondary | ICD-10-CM | POA: Diagnosis not present

## 2018-12-31 DIAGNOSIS — N390 Urinary tract infection, site not specified: Secondary | ICD-10-CM | POA: Diagnosis not present

## 2018-12-31 DIAGNOSIS — R432 Parageusia: Secondary | ICD-10-CM | POA: Diagnosis not present

## 2019-01-30 DIAGNOSIS — L309 Dermatitis, unspecified: Secondary | ICD-10-CM | POA: Diagnosis not present

## 2019-01-30 DIAGNOSIS — Z6832 Body mass index (BMI) 32.0-32.9, adult: Secondary | ICD-10-CM | POA: Diagnosis not present

## 2019-01-30 DIAGNOSIS — R109 Unspecified abdominal pain: Secondary | ICD-10-CM | POA: Diagnosis not present

## 2019-01-30 DIAGNOSIS — F411 Generalized anxiety disorder: Secondary | ICD-10-CM | POA: Diagnosis not present

## 2019-01-30 DIAGNOSIS — R634 Abnormal weight loss: Secondary | ICD-10-CM | POA: Diagnosis not present

## 2019-01-30 DIAGNOSIS — R432 Parageusia: Secondary | ICD-10-CM | POA: Diagnosis not present

## 2019-01-30 DIAGNOSIS — I10 Essential (primary) hypertension: Secondary | ICD-10-CM | POA: Diagnosis not present

## 2019-02-06 ENCOUNTER — Other Ambulatory Visit: Payer: Self-pay | Admitting: Family Medicine

## 2019-02-06 DIAGNOSIS — R109 Unspecified abdominal pain: Secondary | ICD-10-CM

## 2019-02-17 ENCOUNTER — Ambulatory Visit
Admission: RE | Admit: 2019-02-17 | Discharge: 2019-02-17 | Disposition: A | Discharge status: Home / Self Care | Payer: Medicare Other | Source: Ambulatory Visit | Attending: Family Medicine | Admitting: Family Medicine

## 2019-02-17 DIAGNOSIS — R1010 Upper abdominal pain, unspecified: Secondary | ICD-10-CM | POA: Diagnosis not present

## 2019-02-17 DIAGNOSIS — R109 Unspecified abdominal pain: Secondary | ICD-10-CM

## 2019-02-17 MED ORDER — IOPAMIDOL (ISOVUE-300) INJECTION 61%
100.0000 mL | Freq: Once | INTRAVENOUS | Status: AC | PRN
  Administered 2019-02-17: 100 mL via INTRAVENOUS

## 2019-02-18 ENCOUNTER — Ambulatory Visit: Payer: Self-pay | Admitting: Diagnostic Neuroimaging

## 2019-02-18 DIAGNOSIS — R430 Anosmia: Secondary | ICD-10-CM | POA: Diagnosis not present

## 2019-02-18 DIAGNOSIS — K08409 Partial loss of teeth, unspecified cause, unspecified class: Secondary | ICD-10-CM | POA: Diagnosis not present

## 2019-02-18 DIAGNOSIS — K219 Gastro-esophageal reflux disease without esophagitis: Secondary | ICD-10-CM | POA: Diagnosis not present

## 2019-02-18 DIAGNOSIS — Z9089 Acquired absence of other organs: Secondary | ICD-10-CM | POA: Diagnosis not present

## 2019-02-20 DIAGNOSIS — R7309 Other abnormal glucose: Secondary | ICD-10-CM | POA: Diagnosis not present

## 2019-02-20 DIAGNOSIS — I10 Essential (primary) hypertension: Secondary | ICD-10-CM | POA: Diagnosis not present

## 2019-03-23 DIAGNOSIS — M189 Osteoarthritis of first carpometacarpal joint, unspecified: Secondary | ICD-10-CM | POA: Diagnosis not present

## 2019-03-23 DIAGNOSIS — Z743 Need for continuous supervision: Secondary | ICD-10-CM | POA: Diagnosis not present

## 2019-03-23 DIAGNOSIS — I10 Essential (primary) hypertension: Secondary | ICD-10-CM | POA: Diagnosis not present

## 2019-03-23 DIAGNOSIS — R519 Headache, unspecified: Secondary | ICD-10-CM | POA: Diagnosis not present

## 2019-03-23 DIAGNOSIS — M7989 Other specified soft tissue disorders: Secondary | ICD-10-CM | POA: Diagnosis not present

## 2019-03-23 DIAGNOSIS — W1809XA Striking against other object with subsequent fall, initial encounter: Secondary | ICD-10-CM | POA: Diagnosis not present

## 2019-03-23 DIAGNOSIS — S1989XA Other specified injuries of other specified part of neck, initial encounter: Secondary | ICD-10-CM | POA: Diagnosis not present

## 2019-03-23 DIAGNOSIS — M79641 Pain in right hand: Secondary | ICD-10-CM | POA: Diagnosis not present

## 2019-03-23 DIAGNOSIS — M47812 Spondylosis without myelopathy or radiculopathy, cervical region: Secondary | ICD-10-CM | POA: Diagnosis not present

## 2019-03-23 DIAGNOSIS — M7032 Other bursitis of elbow, left elbow: Secondary | ICD-10-CM | POA: Diagnosis not present

## 2019-03-23 DIAGNOSIS — G8911 Acute pain due to trauma: Secondary | ICD-10-CM | POA: Diagnosis not present

## 2019-03-23 DIAGNOSIS — M25522 Pain in left elbow: Secondary | ICD-10-CM | POA: Diagnosis not present

## 2019-03-23 DIAGNOSIS — S6981XA Other specified injuries of right wrist, hand and finger(s), initial encounter: Secondary | ICD-10-CM | POA: Diagnosis not present

## 2019-03-23 DIAGNOSIS — S098XXA Other specified injuries of head, initial encounter: Secondary | ICD-10-CM | POA: Diagnosis not present

## 2019-03-23 DIAGNOSIS — S0990XA Unspecified injury of head, initial encounter: Secondary | ICD-10-CM | POA: Diagnosis not present

## 2019-04-10 DIAGNOSIS — Z6830 Body mass index (BMI) 30.0-30.9, adult: Secondary | ICD-10-CM | POA: Diagnosis not present

## 2019-04-10 DIAGNOSIS — G479 Sleep disorder, unspecified: Secondary | ICD-10-CM | POA: Diagnosis not present

## 2019-04-10 DIAGNOSIS — I10 Essential (primary) hypertension: Secondary | ICD-10-CM | POA: Diagnosis not present

## 2019-04-10 DIAGNOSIS — J3089 Other allergic rhinitis: Secondary | ICD-10-CM | POA: Diagnosis not present

## 2019-04-10 DIAGNOSIS — Z1331 Encounter for screening for depression: Secondary | ICD-10-CM | POA: Diagnosis not present

## 2019-04-10 DIAGNOSIS — K219 Gastro-esophageal reflux disease without esophagitis: Secondary | ICD-10-CM | POA: Diagnosis not present

## 2019-04-10 DIAGNOSIS — Z1159 Encounter for screening for other viral diseases: Secondary | ICD-10-CM | POA: Diagnosis not present

## 2019-05-09 DIAGNOSIS — Z03818 Encounter for observation for suspected exposure to other biological agents ruled out: Secondary | ICD-10-CM | POA: Diagnosis not present

## 2019-05-26 DIAGNOSIS — F430 Acute stress reaction: Secondary | ICD-10-CM | POA: Diagnosis not present

## 2019-05-26 DIAGNOSIS — Z6828 Body mass index (BMI) 28.0-28.9, adult: Secondary | ICD-10-CM | POA: Diagnosis not present

## 2019-07-10 DIAGNOSIS — G479 Sleep disorder, unspecified: Secondary | ICD-10-CM | POA: Diagnosis not present

## 2019-07-10 DIAGNOSIS — I10 Essential (primary) hypertension: Secondary | ICD-10-CM | POA: Diagnosis not present

## 2019-07-10 DIAGNOSIS — Z6826 Body mass index (BMI) 26.0-26.9, adult: Secondary | ICD-10-CM | POA: Diagnosis not present

## 2019-07-10 DIAGNOSIS — R102 Pelvic and perineal pain: Secondary | ICD-10-CM | POA: Diagnosis not present

## 2019-07-21 DIAGNOSIS — I10 Essential (primary) hypertension: Secondary | ICD-10-CM | POA: Diagnosis not present

## 2019-07-25 DIAGNOSIS — Z20822 Contact with and (suspected) exposure to covid-19: Secondary | ICD-10-CM | POA: Diagnosis present

## 2019-07-25 DIAGNOSIS — Z915 Personal history of self-harm: Secondary | ICD-10-CM | POA: Diagnosis not present

## 2019-07-25 DIAGNOSIS — R5383 Other fatigue: Secondary | ICD-10-CM | POA: Diagnosis not present

## 2019-07-25 DIAGNOSIS — I10 Essential (primary) hypertension: Secondary | ICD-10-CM | POA: Diagnosis not present

## 2019-07-25 DIAGNOSIS — R410 Disorientation, unspecified: Secondary | ICD-10-CM | POA: Diagnosis not present

## 2019-07-25 DIAGNOSIS — T4272XA Poisoning by unspecified antiepileptic and sedative-hypnotic drugs, intentional self-harm, initial encounter: Secondary | ICD-10-CM | POA: Diagnosis not present

## 2019-07-25 DIAGNOSIS — M25571 Pain in right ankle and joints of right foot: Secondary | ICD-10-CM | POA: Diagnosis present

## 2019-07-25 DIAGNOSIS — M7989 Other specified soft tissue disorders: Secondary | ICD-10-CM | POA: Diagnosis not present

## 2019-07-25 DIAGNOSIS — R4182 Altered mental status, unspecified: Secondary | ICD-10-CM | POA: Diagnosis not present

## 2019-07-25 DIAGNOSIS — G4700 Insomnia, unspecified: Secondary | ICD-10-CM | POA: Diagnosis not present

## 2019-07-25 DIAGNOSIS — T424X2A Poisoning by benzodiazepines, intentional self-harm, initial encounter: Secondary | ICD-10-CM | POA: Diagnosis not present

## 2019-07-25 DIAGNOSIS — F419 Anxiety disorder, unspecified: Secondary | ICD-10-CM | POA: Diagnosis present

## 2019-07-25 DIAGNOSIS — Z9989 Dependence on other enabling machines and devices: Secondary | ICD-10-CM | POA: Diagnosis not present

## 2019-07-25 DIAGNOSIS — K219 Gastro-esophageal reflux disease without esophagitis: Secondary | ICD-10-CM | POA: Diagnosis not present

## 2019-07-25 DIAGNOSIS — F332 Major depressive disorder, recurrent severe without psychotic features: Secondary | ICD-10-CM | POA: Diagnosis present

## 2019-07-29 DIAGNOSIS — T424X2A Poisoning by benzodiazepines, intentional self-harm, initial encounter: Secondary | ICD-10-CM | POA: Diagnosis not present

## 2019-07-29 DIAGNOSIS — F411 Generalized anxiety disorder: Secondary | ICD-10-CM | POA: Diagnosis present

## 2019-07-29 DIAGNOSIS — R079 Chest pain, unspecified: Secondary | ICD-10-CM | POA: Diagnosis not present

## 2019-07-29 DIAGNOSIS — M25571 Pain in right ankle and joints of right foot: Secondary | ICD-10-CM | POA: Diagnosis present

## 2019-07-29 DIAGNOSIS — F322 Major depressive disorder, single episode, severe without psychotic features: Secondary | ICD-10-CM | POA: Diagnosis not present

## 2019-07-29 DIAGNOSIS — F3341 Major depressive disorder, recurrent, in partial remission: Secondary | ICD-10-CM | POA: Diagnosis not present

## 2019-07-29 DIAGNOSIS — R4182 Altered mental status, unspecified: Secondary | ICD-10-CM | POA: Diagnosis not present

## 2019-07-29 DIAGNOSIS — T4272XA Poisoning by unspecified antiepileptic and sedative-hypnotic drugs, intentional self-harm, initial encounter: Secondary | ICD-10-CM | POA: Diagnosis not present

## 2019-07-29 DIAGNOSIS — I10 Essential (primary) hypertension: Secondary | ICD-10-CM | POA: Diagnosis present

## 2019-07-29 DIAGNOSIS — F332 Major depressive disorder, recurrent severe without psychotic features: Secondary | ICD-10-CM | POA: Diagnosis not present

## 2019-07-29 DIAGNOSIS — K219 Gastro-esophageal reflux disease without esophagitis: Secondary | ICD-10-CM | POA: Diagnosis present

## 2019-07-29 DIAGNOSIS — R45851 Suicidal ideations: Secondary | ICD-10-CM | POA: Diagnosis present

## 2019-07-29 DIAGNOSIS — S298XXA Other specified injuries of thorax, initial encounter: Secondary | ICD-10-CM | POA: Diagnosis not present

## 2019-07-29 DIAGNOSIS — T50902A Poisoning by unspecified drugs, medicaments and biological substances, intentional self-harm, initial encounter: Secondary | ICD-10-CM | POA: Diagnosis not present

## 2019-07-29 DIAGNOSIS — Z0289 Encounter for other administrative examinations: Secondary | ICD-10-CM | POA: Diagnosis not present

## 2019-07-29 DIAGNOSIS — F419 Anxiety disorder, unspecified: Secondary | ICD-10-CM | POA: Diagnosis not present

## 2019-07-29 DIAGNOSIS — K5790 Diverticulosis of intestine, part unspecified, without perforation or abscess without bleeding: Secondary | ICD-10-CM | POA: Diagnosis present

## 2019-07-29 DIAGNOSIS — R443 Hallucinations, unspecified: Secondary | ICD-10-CM | POA: Diagnosis not present

## 2019-07-29 DIAGNOSIS — Z789 Other specified health status: Secondary | ICD-10-CM | POA: Diagnosis not present

## 2019-07-29 DIAGNOSIS — R440 Auditory hallucinations: Secondary | ICD-10-CM | POA: Diagnosis not present

## 2019-08-02 DIAGNOSIS — I10 Essential (primary) hypertension: Secondary | ICD-10-CM | POA: Diagnosis not present

## 2019-08-02 DIAGNOSIS — S298XXA Other specified injuries of thorax, initial encounter: Secondary | ICD-10-CM | POA: Diagnosis not present

## 2019-08-02 DIAGNOSIS — R443 Hallucinations, unspecified: Secondary | ICD-10-CM | POA: Diagnosis not present

## 2019-08-02 DIAGNOSIS — Z0289 Encounter for other administrative examinations: Secondary | ICD-10-CM | POA: Diagnosis not present

## 2019-08-02 DIAGNOSIS — R440 Auditory hallucinations: Secondary | ICD-10-CM | POA: Diagnosis not present

## 2019-08-07 DIAGNOSIS — F332 Major depressive disorder, recurrent severe without psychotic features: Secondary | ICD-10-CM | POA: Diagnosis not present

## 2019-08-10 DIAGNOSIS — F332 Major depressive disorder, recurrent severe without psychotic features: Secondary | ICD-10-CM | POA: Diagnosis not present

## 2019-08-11 DIAGNOSIS — F332 Major depressive disorder, recurrent severe without psychotic features: Secondary | ICD-10-CM | POA: Diagnosis not present

## 2019-08-12 DIAGNOSIS — F332 Major depressive disorder, recurrent severe without psychotic features: Secondary | ICD-10-CM | POA: Diagnosis not present

## 2019-08-13 DIAGNOSIS — F332 Major depressive disorder, recurrent severe without psychotic features: Secondary | ICD-10-CM | POA: Diagnosis not present

## 2019-08-14 DIAGNOSIS — F332 Major depressive disorder, recurrent severe without psychotic features: Secondary | ICD-10-CM | POA: Diagnosis not present

## 2019-08-17 DIAGNOSIS — F332 Major depressive disorder, recurrent severe without psychotic features: Secondary | ICD-10-CM | POA: Diagnosis not present

## 2019-08-18 DIAGNOSIS — F332 Major depressive disorder, recurrent severe without psychotic features: Secondary | ICD-10-CM | POA: Diagnosis not present

## 2019-08-19 DIAGNOSIS — F332 Major depressive disorder, recurrent severe without psychotic features: Secondary | ICD-10-CM | POA: Diagnosis not present

## 2019-08-20 DIAGNOSIS — F332 Major depressive disorder, recurrent severe without psychotic features: Secondary | ICD-10-CM | POA: Diagnosis not present

## 2019-08-21 DIAGNOSIS — F332 Major depressive disorder, recurrent severe without psychotic features: Secondary | ICD-10-CM | POA: Diagnosis not present

## 2019-08-24 DIAGNOSIS — F332 Major depressive disorder, recurrent severe without psychotic features: Secondary | ICD-10-CM | POA: Diagnosis not present

## 2019-08-25 DIAGNOSIS — F332 Major depressive disorder, recurrent severe without psychotic features: Secondary | ICD-10-CM | POA: Diagnosis not present

## 2019-08-26 DIAGNOSIS — F332 Major depressive disorder, recurrent severe without psychotic features: Secondary | ICD-10-CM | POA: Diagnosis not present

## 2019-08-27 DIAGNOSIS — F332 Major depressive disorder, recurrent severe without psychotic features: Secondary | ICD-10-CM | POA: Diagnosis not present

## 2019-08-28 DIAGNOSIS — F332 Major depressive disorder, recurrent severe without psychotic features: Secondary | ICD-10-CM | POA: Diagnosis not present

## 2019-08-31 DIAGNOSIS — F332 Major depressive disorder, recurrent severe without psychotic features: Secondary | ICD-10-CM | POA: Diagnosis not present

## 2019-09-01 DIAGNOSIS — F332 Major depressive disorder, recurrent severe without psychotic features: Secondary | ICD-10-CM | POA: Diagnosis not present

## 2019-09-02 DIAGNOSIS — F332 Major depressive disorder, recurrent severe without psychotic features: Secondary | ICD-10-CM | POA: Diagnosis not present

## 2019-09-04 DIAGNOSIS — F332 Major depressive disorder, recurrent severe without psychotic features: Secondary | ICD-10-CM | POA: Diagnosis not present

## 2019-09-07 DIAGNOSIS — F332 Major depressive disorder, recurrent severe without psychotic features: Secondary | ICD-10-CM | POA: Diagnosis not present

## 2019-09-08 DIAGNOSIS — F332 Major depressive disorder, recurrent severe without psychotic features: Secondary | ICD-10-CM | POA: Diagnosis not present

## 2019-09-09 DIAGNOSIS — F332 Major depressive disorder, recurrent severe without psychotic features: Secondary | ICD-10-CM | POA: Diagnosis not present

## 2019-09-10 DIAGNOSIS — F332 Major depressive disorder, recurrent severe without psychotic features: Secondary | ICD-10-CM | POA: Diagnosis not present

## 2019-09-11 DIAGNOSIS — F332 Major depressive disorder, recurrent severe without psychotic features: Secondary | ICD-10-CM | POA: Diagnosis not present

## 2020-06-30 IMAGING — CT CT ABD-PELV W/ CM
2 of 5 series · 13 of 46 positions shown, 15 images · IV contrast (iopamidol)
Comparison: 12/06/2015

CLINICAL DATA: Upper abdominal pain, weight loss, evaluate for
pancreas mass, colitis

EXAM:
CT ABDOMEN AND PELVIS WITH CONTRAST
TECHNIQUE: Multidetector CT imaging of the abdomen and pelvis was performed
using the standard protocol following bolus administration of
intravenous contrast.
CONTRAST:  100mL ZVSK2B-GWW IOPAMIDOL (ZVSK2B-GWW) INJECTION 61%,
additional oral enteric contrast

[Series 2: abd pelvis 5.00 br40 s3 axial · axial · 0.61mm/px · z∈[+1045,+1420]mm · 10 of 85 slices shown, 12 images]
[im 5/85  soft-tissue]
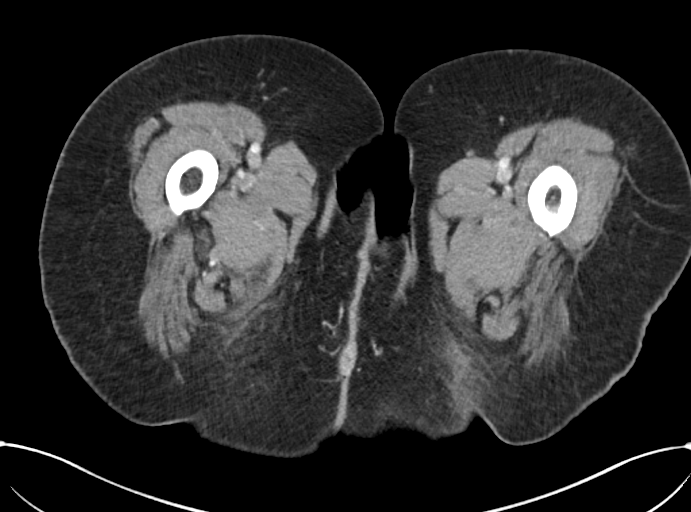
[im 5/85  bone]
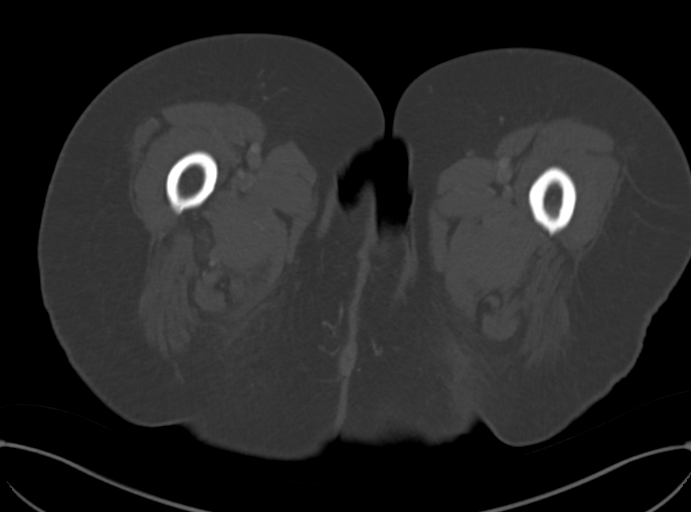
[im 15/85  soft-tissue]
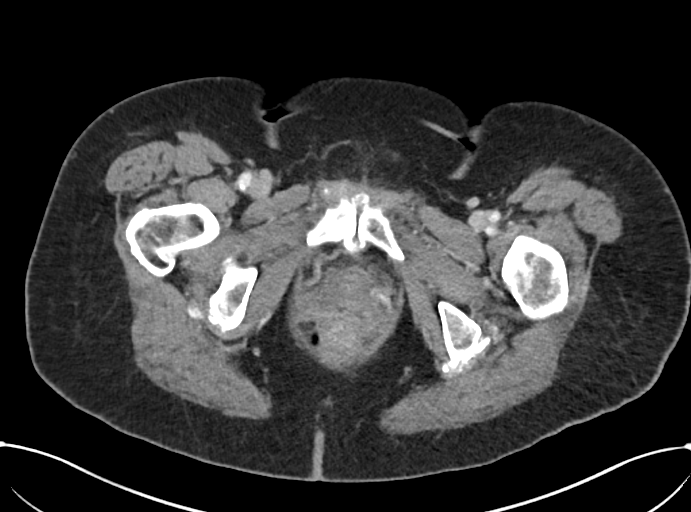
[im 25/85  soft-tissue]
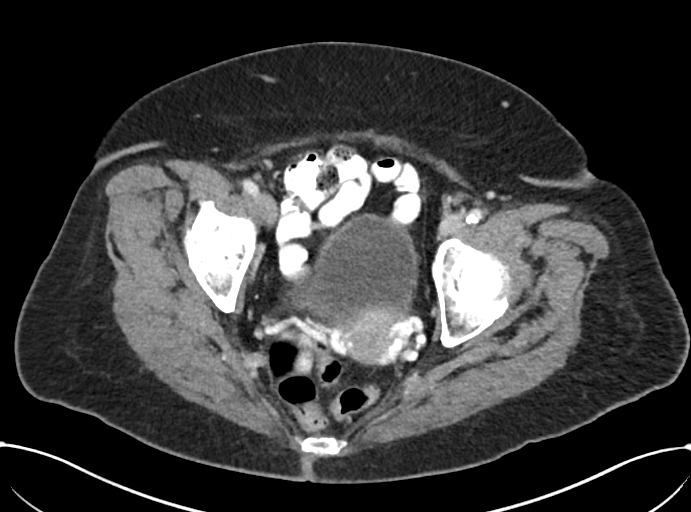
[im 30/85  soft-tissue]
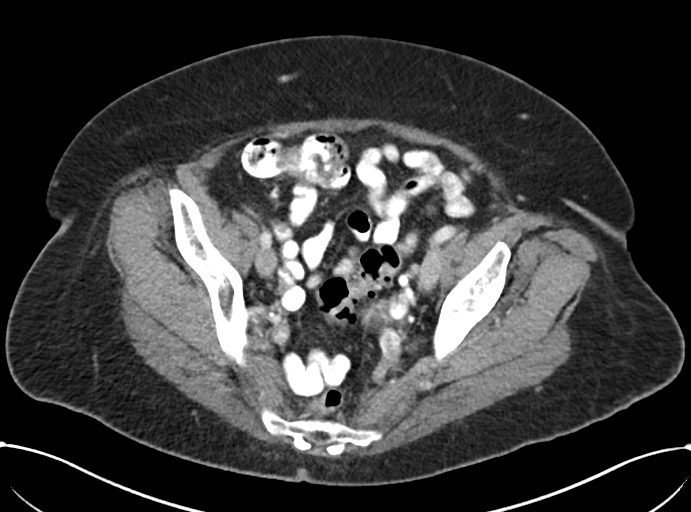
[im 40/85  soft-tissue]
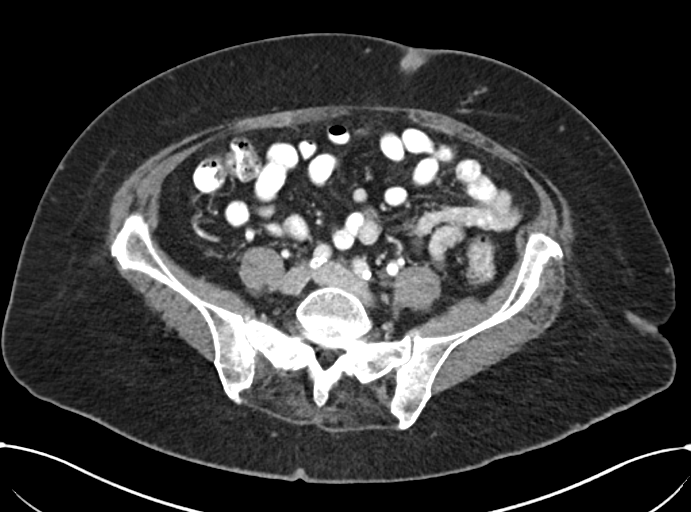
[im 45/85  soft-tissue]
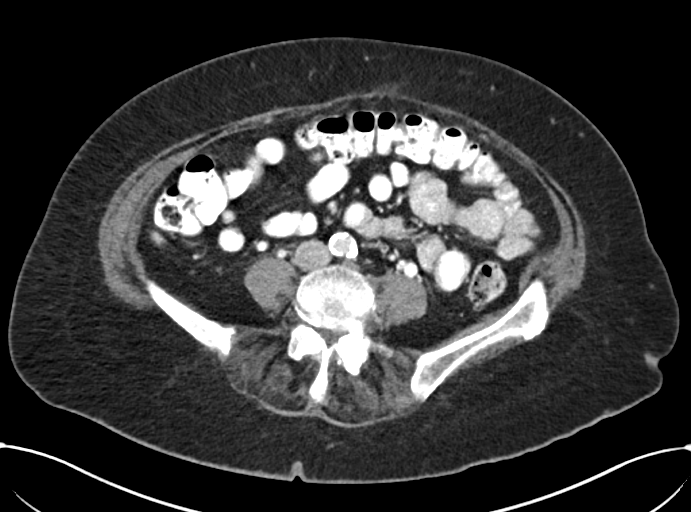
[im 55/85  soft-tissue]
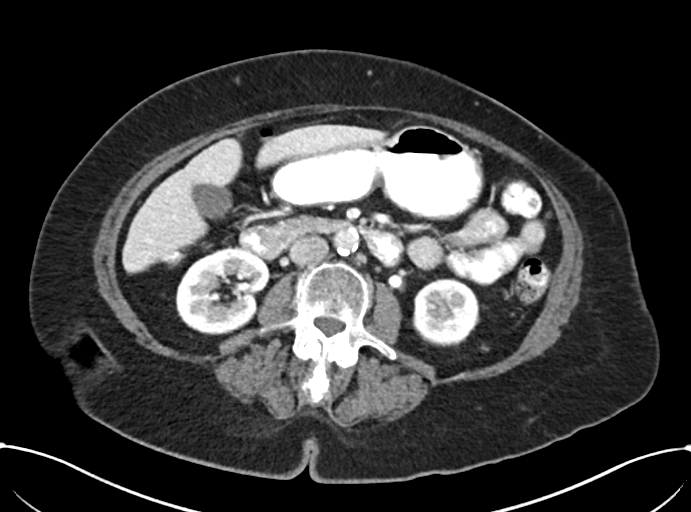
[im 65/85  soft-tissue]
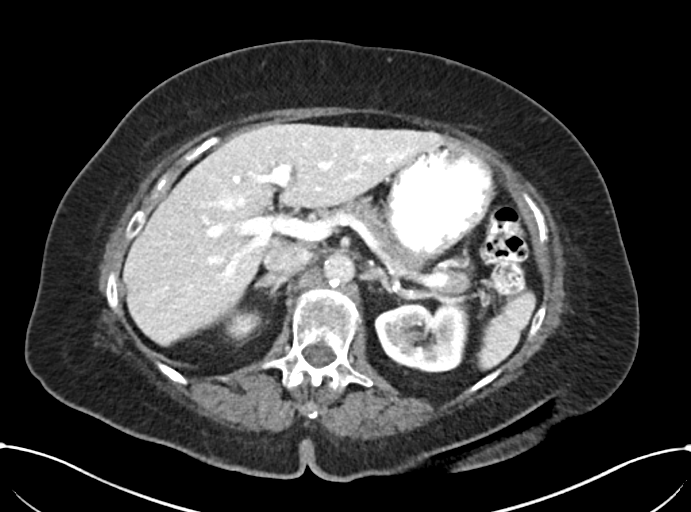
[im 70/85  soft-tissue]
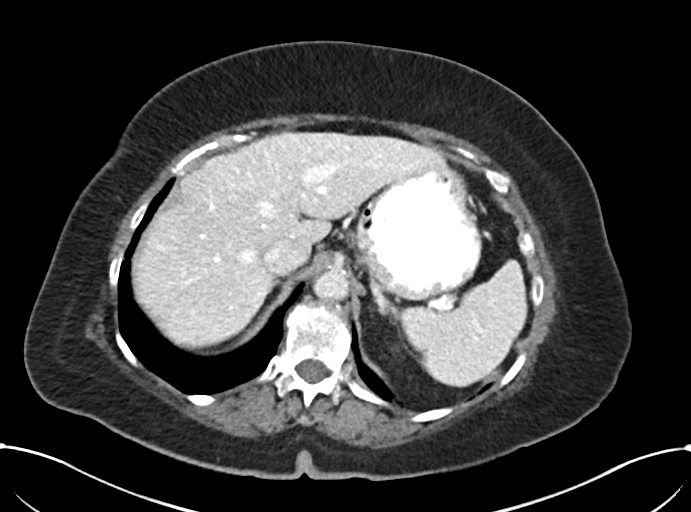
[im 70/85  bone]
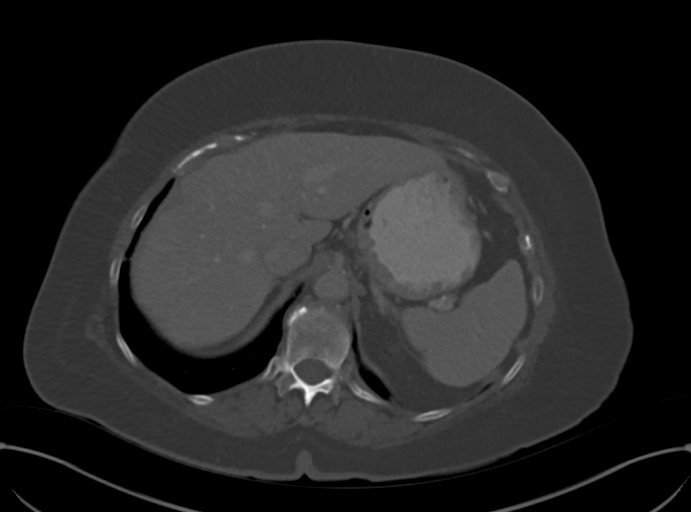
[im 80/85  soft-tissue]
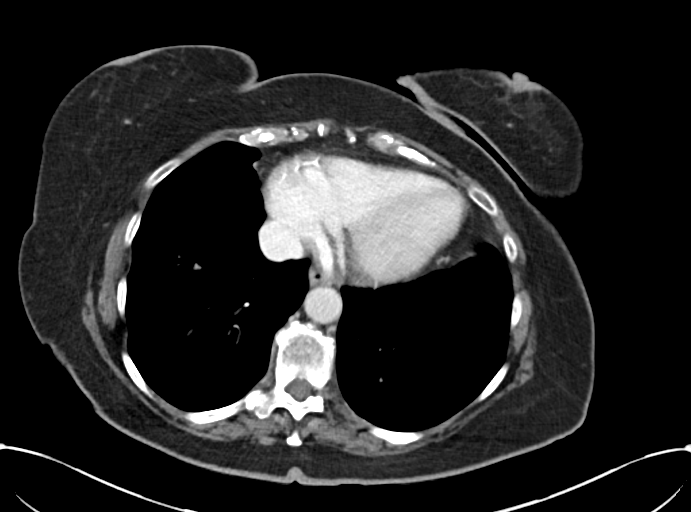

[Series 6: abd pelvis 2.00 br40 s3 cor · coronal · 0.80mm/px · 3 of 147 slices shown]
[im 49/147  soft-tissue]
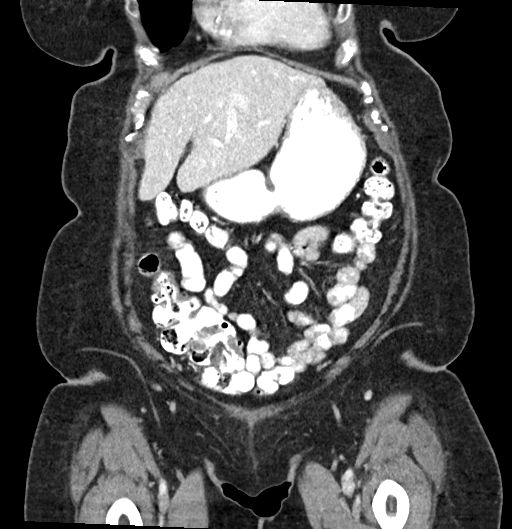
[im 65/147  soft-tissue]
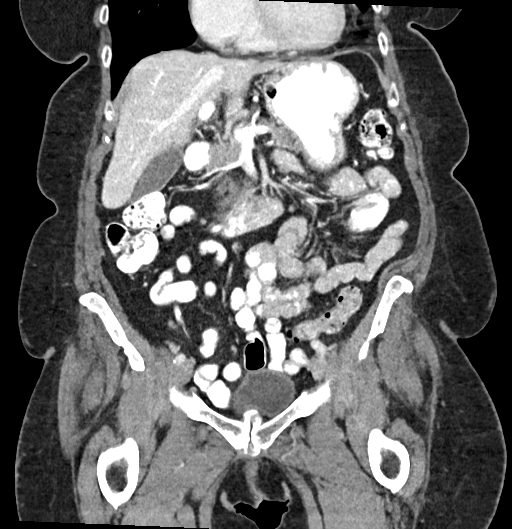
[im 82/147  soft-tissue]
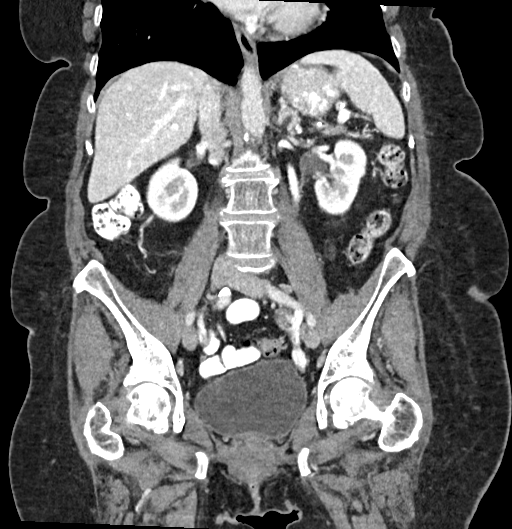

[13 of 46 positions shown; findings below may reference images not displayed]

FINDINGS: Lower chest: No acute abnormality.  Coronary artery calcifications.

Hepatobiliary: No solid liver abnormality is seen. No gallstones,
gallbladder wall thickening, or biliary dilatation.

Pancreas: Unremarkable. No pancreatic ductal dilatation or
surrounding inflammatory changes.

Spleen: Normal in size without significant abnormality.

Adrenals/Urinary Tract: Adrenal glands are unremarkable. Kidneys are
normal, without renal calculi, solid lesion, or hydronephrosis.
Bladder is unremarkable.

Stomach/Bowel: Stomach is within normal limits. Appendix is not
clearly visualized and may be surgically absent. No evidence of
bowel wall thickening, distention, or inflammatory changes. Sigmoid
diverticulosis.

Vascular/Lymphatic: Aortic atherosclerosis. No enlarged abdominal or
pelvic lymph nodes.

Reproductive: Prominent bilateral uterine and adnexal varices. The
left ovarian vein measures up to 6 mm near the confluence with the
left renal vein.

Other: No abdominal wall hernia or abnormality. No abdominopelvic
ascites.

Musculoskeletal: No acute or significant osseous findings.
IMPRESSION: 1. No acute CT findings of the abdomen or pelvis to abdominal pain
or weight loss. No evidence of pancreatic mass or colitis per
ordering indication.

2. Prominent bilateral uterine and adnexal varices, which can be
seen in pelvic congestion syndrome. The left ovarian vein measures
up to 6 mm near the confluence with the left renal vein. Correlate
with referable clinical symptoms, if present.

3.  Other chronic and incidental findings as detailed above.
